# Patient Record
Sex: Male | Born: 1975
Health system: Southern US, Community
[De-identification: ages and names within clinical notes are randomized; demographics above are authoritative.]

## PROBLEM LIST (undated history)

## (undated) DIAGNOSIS — T7840XA Allergy, unspecified, initial encounter: Secondary | ICD-10-CM

## (undated) DIAGNOSIS — E785 Hyperlipidemia, unspecified: Secondary | ICD-10-CM

## (undated) DIAGNOSIS — K219 Gastro-esophageal reflux disease without esophagitis: Secondary | ICD-10-CM

## (undated) DIAGNOSIS — F0781 Postconcussional syndrome: Secondary | ICD-10-CM

## (undated) HISTORY — DX: Hyperlipidemia, unspecified: E78.5

## (undated) HISTORY — DX: Gastro-esophageal reflux disease without esophagitis: K21.9

## (undated) HISTORY — DX: Postconcussional syndrome: F07.81

## (undated) HISTORY — DX: Allergy, unspecified, initial encounter: T78.40XA

---

## 2006-04-10 ENCOUNTER — Ambulatory Visit: Payer: Self-pay | Admitting: Family Medicine

## 2006-04-30 ENCOUNTER — Ambulatory Visit: Payer: Self-pay | Admitting: Family Medicine

## 2006-04-30 LAB — CONVERTED CEMR LAB
ALT: 40 units/L (ref 0–40)
AST: 24 units/L (ref 0–37)
Albumin: 4.2 g/dL (ref 3.5–5.2)
Alkaline Phosphatase: 53 units/L (ref 39–117)
BUN: 12 mg/dL (ref 6–23)
Bilirubin, Direct: 0.1 mg/dL (ref 0.0–0.3)
CO2: 30 meq/L (ref 19–32)
Calcium: 9.3 mg/dL (ref 8.4–10.5)
Chloride: 103 meq/L (ref 96–112)
Cholesterol: 231 mg/dL (ref 0–200)
Creatinine, Ser: 1 mg/dL (ref 0.4–1.5)
Direct LDL: 172.4 mg/dL
GFR calc Af Amer: 113 mL/min
GFR calc non Af Amer: 93 mL/min
Glucose, Bld: 103 mg/dL — ABNORMAL HIGH (ref 70–99)
HDL: 41.1 mg/dL (ref >39.0)
Potassium: 4.1 meq/L (ref 3.5–5.1)
Sodium: 139 meq/L (ref 135–145)
Total Bilirubin: 0.9 mg/dL (ref 0.3–1.2)
Total CHOL/HDL Ratio: 5.6
Total Protein: 7.1 g/dL (ref 6.0–8.3)
Triglycerides: 115 mg/dL (ref 0–149)
VLDL: 23 mg/dL (ref 0–40)

## 2007-04-25 ENCOUNTER — Ambulatory Visit: Payer: Self-pay | Admitting: Family Medicine

## 2007-04-25 DIAGNOSIS — K219 Gastro-esophageal reflux disease without esophagitis: Secondary | ICD-10-CM | POA: Insufficient documentation

## 2007-04-25 DIAGNOSIS — J309 Allergic rhinitis, unspecified: Secondary | ICD-10-CM | POA: Insufficient documentation

## 2007-04-30 ENCOUNTER — Ambulatory Visit: Payer: Self-pay | Admitting: Family Medicine

## 2007-04-30 DIAGNOSIS — E7800 Pure hypercholesterolemia, unspecified: Secondary | ICD-10-CM | POA: Insufficient documentation

## 2007-04-30 DIAGNOSIS — E1169 Type 2 diabetes mellitus with other specified complication: Secondary | ICD-10-CM | POA: Insufficient documentation

## 2007-05-06 LAB — CONVERTED CEMR LAB
ALT: 51 units/L (ref 0–53)
AST: 32 units/L (ref 0–37)
Albumin: 4.3 g/dL (ref 3.5–5.2)
Alkaline Phosphatase: 52 units/L (ref 39–117)
BUN: 5 mg/dL — ABNORMAL LOW (ref 6–23)
Bilirubin, Direct: 0.1 mg/dL (ref 0.0–0.3)
CO2: 30 meq/L (ref 19–32)
Calcium: 9.7 mg/dL (ref 8.4–10.5)
Chloride: 101 meq/L (ref 96–112)
Cholesterol: 212 mg/dL (ref 0–200)
Creatinine, Ser: 1 mg/dL (ref 0.4–1.5)
Direct LDL: 168.6 mg/dL
GFR calc Af Amer: 112 mL/min
GFR calc non Af Amer: 93 mL/min
Glucose, Bld: 97 mg/dL (ref 70–99)
HDL: 38.6 mg/dL — ABNORMAL LOW (ref >39.0)
Potassium: 3.7 meq/L (ref 3.5–5.1)
Sodium: 138 meq/L (ref 135–145)
Total Bilirubin: 1.1 mg/dL (ref 0.3–1.2)
Total CHOL/HDL Ratio: 5.5
Total Protein: 7.1 g/dL (ref 6.0–8.3)
Triglycerides: 96 mg/dL (ref 0–149)
VLDL: 19 mg/dL (ref 0–40)

## 2007-08-06 ENCOUNTER — Ambulatory Visit: Payer: Self-pay | Admitting: Family Medicine

## 2007-08-12 LAB — CONVERTED CEMR LAB
Cholesterol: 221 mg/dL (ref 0–200)
Direct LDL: 179.2 mg/dL
HDL: 34.5 mg/dL — ABNORMAL LOW (ref >39.0)
Total CHOL/HDL Ratio: 6.4
Triglycerides: 87 mg/dL (ref 0–149)
VLDL: 17 mg/dL (ref 0–40)

## 2007-11-07 ENCOUNTER — Encounter: Payer: Self-pay | Admitting: Family Medicine

## 2007-11-12 ENCOUNTER — Ambulatory Visit: Payer: Self-pay | Admitting: Family Medicine

## 2007-11-13 LAB — CONVERTED CEMR LAB
ALT: 48 units/L (ref 0–53)
AST: 32 units/L (ref 0–37)
Cholesterol: 153 mg/dL (ref 0–200)
HDL: 42.7 mg/dL (ref >39.0)
LDL Cholesterol: 91 mg/dL (ref 0–99)
Total CHOL/HDL Ratio: 3.6
Triglycerides: 99 mg/dL (ref 0–149)
VLDL: 20 mg/dL (ref 0–40)

## 2007-12-17 ENCOUNTER — Ambulatory Visit: Payer: Self-pay | Admitting: Family Medicine

## 2007-12-17 DIAGNOSIS — L218 Other seborrheic dermatitis: Secondary | ICD-10-CM | POA: Insufficient documentation

## 2007-12-17 DIAGNOSIS — J069 Acute upper respiratory infection, unspecified: Secondary | ICD-10-CM | POA: Insufficient documentation

## 2008-01-26 ENCOUNTER — Telehealth: Payer: Self-pay | Admitting: Family Medicine

## 2008-05-20 ENCOUNTER — Ambulatory Visit: Payer: Self-pay | Admitting: Family Medicine

## 2008-05-20 LAB — CONVERTED CEMR LAB
ALT: 47 units/L (ref 0–53)
AST: 27 units/L (ref 0–37)
Cholesterol: 152 mg/dL (ref 0–200)
HDL: 37.7 mg/dL — ABNORMAL LOW (ref >39.00)
LDL Cholesterol: 98 mg/dL (ref 0–99)
Total CHOL/HDL Ratio: 4
Triglycerides: 83 mg/dL (ref 0.0–149.0)
VLDL: 16.6 mg/dL (ref 0.0–40.0)

## 2008-08-13 ENCOUNTER — Encounter (INDEPENDENT_AMBULATORY_CARE_PROVIDER_SITE_OTHER): Payer: Self-pay | Admitting: *Deleted

## 2008-09-30 ENCOUNTER — Ambulatory Visit: Payer: Self-pay | Admitting: Family Medicine

## 2008-09-30 LAB — CONVERTED CEMR LAB
Cholesterol, target level: 200 mg/dL
HDL goal, serum: 40 mg/dL
LDL Goal: 160 mg/dL

## 2008-10-12 ENCOUNTER — Ambulatory Visit: Payer: Self-pay | Admitting: Family Medicine

## 2008-10-12 DIAGNOSIS — R42 Dizziness and giddiness: Secondary | ICD-10-CM | POA: Insufficient documentation

## 2008-10-12 DIAGNOSIS — R519 Headache, unspecified: Secondary | ICD-10-CM | POA: Insufficient documentation

## 2008-10-12 DIAGNOSIS — R51 Headache: Secondary | ICD-10-CM | POA: Insufficient documentation

## 2008-11-04 ENCOUNTER — Ambulatory Visit: Payer: Self-pay | Admitting: Family Medicine

## 2008-11-04 DIAGNOSIS — R109 Unspecified abdominal pain: Secondary | ICD-10-CM | POA: Insufficient documentation

## 2008-11-04 DIAGNOSIS — K6289 Other specified diseases of anus and rectum: Secondary | ICD-10-CM | POA: Insufficient documentation

## 2008-11-04 DIAGNOSIS — H9319 Tinnitus, unspecified ear: Secondary | ICD-10-CM | POA: Insufficient documentation

## 2008-11-04 LAB — CONVERTED CEMR LAB
Bilirubin Urine: NEGATIVE
Blood in Urine, dipstick: NEGATIVE
Glucose, Urine, Semiquant: NEGATIVE
Ketones, urine, test strip: NEGATIVE
Nitrite: NEGATIVE
Protein, U semiquant: NEGATIVE
Specific Gravity, Urine: 1.015
Urobilinogen, UA: 0.2
WBC Urine, dipstick: NEGATIVE
pH: 7

## 2008-11-22 ENCOUNTER — Ambulatory Visit: Payer: Self-pay | Admitting: Family Medicine

## 2008-11-26 LAB — CONVERTED CEMR LAB
ALT: 48 units/L (ref 0–53)
AST: 30 units/L (ref 0–37)
Albumin: 4.6 g/dL (ref 3.5–5.2)
Alkaline Phosphatase: 53 units/L (ref 39–117)
BUN: 12 mg/dL (ref 6–23)
Bilirubin, Direct: 0.1 mg/dL (ref 0.0–0.3)
CO2: 32 meq/L (ref 19–32)
Calcium: 9.5 mg/dL (ref 8.4–10.5)
Chloride: 108 meq/L (ref 96–112)
Cholesterol: 162 mg/dL (ref 0–200)
Creatinine, Ser: 0.9 mg/dL (ref 0.4–1.5)
GFR calc non Af Amer: 103.38 mL/min (ref >60)
Glucose, Bld: 99 mg/dL (ref 70–99)
HDL: 36.4 mg/dL — ABNORMAL LOW (ref >39.00)
LDL Cholesterol: 108 mg/dL — ABNORMAL HIGH (ref 0–99)
Potassium: 4.3 meq/L (ref 3.5–5.1)
Sodium: 143 meq/L (ref 135–145)
Total Bilirubin: 1 mg/dL (ref 0.3–1.2)
Total CHOL/HDL Ratio: 4
Total Protein: 7.5 g/dL (ref 6.0–8.3)
Triglycerides: 86 mg/dL (ref 0.0–149.0)
VLDL: 17.2 mg/dL (ref 0.0–40.0)

## 2009-05-20 ENCOUNTER — Ambulatory Visit: Payer: Self-pay | Admitting: Family Medicine

## 2009-07-01 ENCOUNTER — Ambulatory Visit: Payer: Self-pay | Admitting: Family Medicine

## 2009-09-19 ENCOUNTER — Ambulatory Visit: Payer: Self-pay | Admitting: Family Medicine

## 2009-09-19 DIAGNOSIS — J029 Acute pharyngitis, unspecified: Secondary | ICD-10-CM | POA: Insufficient documentation

## 2009-09-19 LAB — CONVERTED CEMR LAB: Rapid Strep: NEGATIVE

## 2009-09-23 LAB — CONVERTED CEMR LAB
ALT: 44 units/L (ref 0–53)
AST: 25 units/L (ref 0–37)
Albumin: 4.4 g/dL (ref 3.5–5.2)
Alkaline Phosphatase: 52 units/L (ref 39–117)
Bilirubin, Direct: 0.1 mg/dL (ref 0.0–0.3)
Cholesterol: 163 mg/dL (ref 0–200)
HDL: 36.5 mg/dL — ABNORMAL LOW (ref >39.00)
LDL Cholesterol: 106 mg/dL — ABNORMAL HIGH (ref 0–99)
Total Bilirubin: 0.9 mg/dL (ref 0.3–1.2)
Total CHOL/HDL Ratio: 4
Total Protein: 7.2 g/dL (ref 6.0–8.3)
Triglycerides: 103 mg/dL (ref 0.0–149.0)
VLDL: 20.6 mg/dL (ref 0.0–40.0)

## 2009-11-15 ENCOUNTER — Encounter (INDEPENDENT_AMBULATORY_CARE_PROVIDER_SITE_OTHER): Payer: Self-pay | Admitting: *Deleted

## 2009-11-29 ENCOUNTER — Ambulatory Visit: Payer: Self-pay | Admitting: Family Medicine

## 2010-01-16 ENCOUNTER — Ambulatory Visit: Payer: Self-pay | Admitting: Family Medicine

## 2010-02-03 ENCOUNTER — Encounter: Payer: Self-pay | Admitting: Family Medicine

## 2010-02-13 ENCOUNTER — Telehealth: Payer: Self-pay | Admitting: Family Medicine

## 2010-03-28 NOTE — Assessment & Plan Note (Signed)
Summary: ?VERTIGO/CLE   Vital Signs:  Patient profile:   35 year old male Height:      69 inches Weight:      181.8 pounds BMI:     26.94 Temp:     98.2 degrees F oral Pulse rate:   64 / minute Pulse rhythm:   regular BP sitting:   110 / 80  (left arm) Cuff size:   regular  Vitals Entered By: Benny Lennert CMA (AAMA) (November 04, 2008 12:04 PM)  Hearing Screen  20db HL: Left  Right  500 hz: 20db 1000 hz: 20db 2000 hz: 20db 4000 hz: 20db   Hearing Testing Entered By: Benny Lennert CMA Duncan Dull) (November 04, 2008 12:54 PM)   History of Present Illness: Chief complaint ? vertigo  1 month ago see n by Everrett Coombe..diagnoses with vertigo.Marland Kitchengiven antivert. Room spinning, worse with moving, emesis, worse looking down. Gradually improving... happening less frequently. Much less severe then the first few days. Has not needed meclizine in last 5 days.  HAs been practicing balance some.  Ears throbbing some last week. No congestion.   B high pitched tinnitus in ears, not sure when started. No hearing loss.  Has noted lump in right posteroir neck  neck...was tender initially, no pain now.   Also...notes Occ sharp pain in perineum..no dysuria, no change in urination Had some last year....3 days ago had an episode of this, then daily since.    Current Medications (verified): 1)  Simvastatin 40 Mg  Tabs (Simvastatin) .... Take 1 Tablet By Mouth Once A Day 2)  Multivitamins   Tabs (Multiple Vitamin) .... Take 1 Tablet By Mouth Once A Day 3)  Slo-Niacin 500 Mg Cr-Tabs (Niacin) 4)  Ketoconazole 2 % Sham (Ketoconazole) .... Shampoo Daily  To Twice Daily As Needed. 5)  Echinacea 400 Mg Caps (Echinacea) .... Otc As Directed. 6)  Antivert 25 Mg Tabs (Meclizine Hcl) .... Take 1 Every 8 Hrs As Needed Dizziness 7)  Ofloxacin 400 Mg Tabs (Ofloxacin) .Marland Kitchen.. 1 Tab By Mouth X 1 Then 300 Mg Two Times A Day 8)  Ofloxacin 300 Mg Tabs (Ofloxacin) .Marland Kitchen.. 1 Tab By Mouth Two Times A Day X 10  Days  Allergies (verified): No Known Drug Allergies  Past History:  Past medical, surgical, family and social histories (including risk factors) reviewed, and no changes noted (except as noted below).  Past Medical History: Reviewed history from 04/25/2007 and no changes required. Allergic rhinitis GERD  Family History: Reviewed history from 04/25/2007 and no changes required. Father alive at age 32 with high cholesterol. Mother alive at age 33 with high cholesterol. No MI before age 91. One sister who is healthy. Paternal grandfather with prediabetes. Maternal grandmother with lung cancer. No other cancer that runs in the family.  Social History: Reviewed history from 04/25/2007 and no changes required. No tobacco use. Occasional alcoholic drinks about two per month. No drug use. 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 He has been married for three years. He has no children. He does not get regular exercise. He eats three meals per day of fruits, vegetables, occasional fast food about three times per week, and some water.   Review of Systems General:  Denies fatigue and fever. CV:  Denies chest pain or discomfort. Resp:  Denies shortness of breath. GI:  Denies abdominal pain, bloody stools, constipation, and diarrhea. GU:  Denies dysuria, erectile dysfunction, and hematuria.  Physical Exam  General:  Well-developed,well-nourished,in no acute distress; alert,appropriate and cooperative throughout examination Head:  Normocephalic and atraumatic without obvious abnormalities. No apparent alopecia or balding. Eyes:  No corneal or conjunctival inflammation noted. EOMI. Perrla. Funduscopic exam benign, without hemorrhages, exudates or papilledema. Vision grossly normal. Ears:  cerumen in lef tear, right era clear, B TMS clear Nose:  External nasal examination shows no deformity or inflammation. Nasal mucosa are pink and moist  without lesions or exudates. Mouth:  Oral mucosa and oropharynx without lesions or exudates.  Teeth in good repair. Neck:  no carotid bruit or thyromegaly no cervical or supraclavicular lymphadenopathy  Lungs:  Normal respiratory effort, chest expands symmetrically. Lungs are clear to auscultation, no crackles or wheezes. Heart:  Normal rate and regular rhythm. S1 and S2 normal without gallop, murmur, click, rub or other extra sounds. Abdomen:  Bowel sounds positive,abdomen soft and non-tender without masses, organomegaly or hernias noted. Genitalia:  Testes bilaterally descended without nodularity, tenderness or masses. No scrotal masses or lesions. No penis lesions or urethral discharge. Prostate:  ttp over perineum, rectal not performed Neurologic:  No cranial nerve deficits noted. Station and gait are normal. Plantar reflexes are down-going bilaterally. DTRs are symmetrical throughout. Sensory, motor and coordinative functions appear intact.   Impression & Recommendations:  Problem # 1:  VERTIGO (ICD-780.4) Home BPPV exercsies...if not resolving in 2 weeks...refer to ENT given persistance and tinnitus.   Nml neuro exam.     Antivert 25 Mg Tabs (Meclizine hcl) .Marland Kitchen... Take 1 every 8 hrs as needed dizziness  Problem # 2:  TINNITUS (ICD-388.30) Improved on left with ears being irrigated...if continues refer to ENT. No evidence of hearing loss.   Problem # 3:  PELVIC PAIN, ACUTE (ICD-789.09) Most likely prostate infection...will treat with ofloxacin given age <35 to cover GC/chlam.  Refer to urology if pain persisting.   Complete Medication List: 1)  Simvastatin 40 Mg Tabs (Simvastatin) .... Take 1 tablet by mouth once a day 2)  Multivitamins Tabs (Multiple vitamin) .... Take 1 tablet by mouth once a day 3)  Slo-niacin 500 Mg Cr-tabs (Niacin) 4)  Ketoconazole 2 % Sham (Ketoconazole) .... Shampoo daily  to twice daily as needed. 5)  Echinacea 400 Mg Caps (Echinacea) .... Otc as  directed. 6)  Antivert 25 Mg Tabs (Meclizine hcl) .... Take 1 every 8 hrs as needed dizziness 7)  Ofloxacin 400 Mg Tabs (Ofloxacin) .Marland Kitchen.. 1 tab by mouth x 1 then 300 mg two times a day 8)  Ofloxacin 300 Mg Tabs (Ofloxacin) .Marland Kitchen.. 1 tab by mouth two times a day x 10 days  Patient Instructions: 1)  Push fluids. 2)  Start antibitoics, complete course. 3)  Call for ENT referral if ears ringing and vertigo continues after another 2 weeks.  4)  Home exercsies for vertigo.  Prescriptions: OFLOXACIN 300 MG TABS (OFLOXACIN) 1 tab by mouth two times a day x 10 days  #20 x 0   Entered and Authorized by:   Kerby Nora MD   Signed by:   Kerby Nora MD on 11/04/2008   Method used:   Electronically to        [facility]. [address] [phone]* (retail)       [address] [place], [place]  [id number]       Ph: [phone]       Fax: [phone]   RxID:   [id number] OFLOXACIN 400 MG TABS (OFLOXACIN) 1 tab by mouth x 1  then 300 mg two times a day  #1 x 0   Entered and Authorized by:   Kerby Nora MD   Signed by:   Kerby Nora MD on 11/04/2008   Method used:   Electronically to        [facility]. [address] [phone]* (retail)       [address] [place], [place]  [address]       Ph: [phone]       Fax: [phone]   RxID:   [id number]   Current Allergies (reviewed today): No known allergies   Laboratory Results   Urine Tests  Date/Time Received: November 04, 2008 12:54 PM  Date/Time Reported: November 04, 2008 12:54 PM  Routine Urinalysis   Color: yellow Appearance: Clear Glucose: negative   (Normal Range: Negative) Bilirubin: negative   (Normal Range: Negative) Ketone: negative   (Normal Range: Negative) Spec. Gravity: 1.015   (Normal Range: 1.003-1.035) Blood: negative   (Normal Range: Negative) pH: 7.0   (Normal Range: 5.0-8.0) Protein: negative   (Normal Range: Negative) Urobilinogen: 0.2   (Normal Range: 0-1) Nitrite: negative   (Normal Range:  Negative) Leukocyte Esterace: negative   (Normal Range: Negative)

## 2010-03-28 NOTE — Miscellaneous (Signed)
[facility]   Imported By: Eleonore Chiquito 11/10/2007 12:21:12  _____________________________________________________________________  External Attachment:    Type:   Image     Comment:   External Document  Appended Document: Orders Update    Clinical Lists Changes  Observations: Added new observation of FLUVAXDUE: 10/2008 (11/11/2007 8:13) Added new observation of FLU VAX: given (11/07/2007 8:13)       Preventive Care Screening  Last Flu Shot:    Date:  11/07/2007    Next Due:  10/2008    Results:  given

## 2010-03-28 NOTE — Assessment & Plan Note (Signed)
Summary: CONGESTION/MK   Vital Signs:  Patient Profile:   35 Years Old Male Weight:      186.50 pounds Temp:     98.6 degrees F oral Pulse rate:   68 / minute Pulse rhythm:   regular BP sitting:   122 / 80  (left arm) Cuff size:   regular  Vitals Entered By: Delilah Shan (December 17, 2007 12:28 PM)                 Chief Complaint:  Congestion, green nasal discharge, cough, and low grade fever.Marland Kitchen  History of Present Illness: dandruff shampoo OTC helps some but he wants to try Rx shampoo  Acute Visit History:      The patient complains of cough, headache, nasal discharge, and sinus problems.  These symptoms began 6 days ago.  He denies fever.  Other comments include: vit C and echinachea not using other medication.        The character of the cough is described as mild.  There is no history of wheezing or shortness of breath associated with his cough.        He complains of sinus pressure, nasal congestion, purulent drainage, and frontal headache.           Current Allergies (reviewed today): No known allergies   Past Medical History:    Reviewed history from 04/25/2007 and no changes required:       Allergic rhinitis       GERD   Social History:    Reviewed history from 04/25/2007 and no changes required:       No tobacco use. Occasional alcoholic drinks about two       per month. No drug use. 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 He has been married for three       years. He has no children. He does not get regular exercise. He eats       three meals per day of fruits, vegetables, occasional fast food about       three times per week, and some water.     Review of Systems      See HPI   Physical Exam  General:     Well-developed,well-nourished,in no acute distress; alert,appropriate and cooperative throughout examination Head:     no max sinus ttp Ears:     External ear exam shows no significant  lesions or deformities.  Otoscopic examination reveals clear canals, tympanic membranes are intact bilaterally without bulging, retraction, inflammation or discharge. Hearing is grossly normal bilaterally. Nose:     External nasal examination shows no deformity or inflammation. Nasal mucosa are pink and moist without lesions or exudates. Mouth:     Oral mucosa and oropharynx without lesions or exudates.  Teeth in good repair. MMM Neck:     no carotid bruit or thyromegaly  Lungs:     Normal respiratory effort, chest expands symmetrically. Lungs are clear to auscultation, no crackles or wheezes. Heart:     Normal rate and regular rhythm. S1 and S2 normal without gallop, murmur, click, rub or other extra sounds. Cervical Nodes:     No lymphadenopathy noted    Impression & Recommendations:  Problem # 1:  URI (ICD-465.9) Symptomatic care with mucinex, decongestant and saline, if not improving may fill antibiotic.   Problem # 2:  DANDRUFF (ICD-690.18) Treat with ketokonazole shampoo once daily to two times a  day.  Complete Medication List: 1)  Simvastatin 40 Mg Tabs (Simvastatin) .... Take 1 tablet by mouth once a day 2)  Multivitamins Tabs (Multiple vitamin) .... Take 1 tablet by mouth once a day 3)  Slo-niacin 500 Mg Cr-tabs (Niacin) 4)  Amoxicillin 500 Mg Tabs (Amoxicillin) .... 2 tab by mouth two times a day x 10 days 5)  Ketoconazole 2 % Sham (Ketoconazole) .... Shampoo daily  to twice daily   Patient Instructions: 1)  Mucinex decongestant 2)  Nasal saline 3-4 times a day   Prescriptions: KETOCONAZOLE 2 % SHAM (KETOCONAZOLE) Shampoo daily  to twice daily  #1 bottle x 0   Entered and Authorized by:   Kerby Nora MD   Signed by:   Kerby Nora MD on 12/17/2007   Method used:   Print then Give to Patient   RxID:   [id number] AMOXICILLIN 500 MG TABS (AMOXICILLIN) 2 tab by mouth two times a day x 10 days  #40 x 0   Entered and Authorized by:   Kerby Nora MD   Signed by:    Kerby Nora MD on 12/17/2007   Method used:   Print then Give to Patient   RxID:   [id number]  ] Current Allergies (reviewed today): No known allergies  Current Medications (including changes made in today's visit):  SIMVASTATIN 40 MG  TABS (SIMVASTATIN) Take 1 tablet by mouth once a day MULTIVITAMINS   TABS (MULTIPLE VITAMIN) Take 1 tablet by mouth once a day SLO-NIACIN 500 MG CR-TABS (NIACIN)  AMOXICILLIN 500 MG TABS (AMOXICILLIN) 2 tab by mouth two times a day x 10 days KETOCONAZOLE 2 % SHAM (KETOCONAZOLE) Shampoo daily  to twice daily

## 2010-03-28 NOTE — Assessment & Plan Note (Signed)
Summary: 2ND HEP B SHOT/DLO  Nurse Visit   Allergies: No Known Drug Allergies  Immunizations Administered:  Hepatitis B Vaccine # 2:    Vaccine Type: State HepB Ped/Adol    Site: left deltoid    Mfr: Merck    Dose: 0.1 ml    Route: IM    Given by: Lowella Petties CMA    Exp. Date: 06/30/2011    Lot #: [id number]    VIS given: 09/12/05 version given Jul 01, 2009.  Orders Added: 1)  State-Hepatitis B Vaccine Ped/Adol 3 dose IM  [90744S] 2)  Admin 1st Vaccine [11914]

## 2010-03-28 NOTE — Miscellaneous (Signed)
Summary: [facility]  Clinical Lists Changes  Observations: Added new observation of FLU VAX: Historical (11/06/2009 10:30)      Immunization History:  Influenza Immunization History:    Influenza:  historical (11/06/2009)  [facility] [address] in [place].          Lowella Petties CMA  November 15, 2009 10:31 AM

## 2010-03-28 NOTE — Assessment & Plan Note (Signed)
Summary: HEP B/BEDSOLE/CLE  Nurse Visit   Allergies: No Known Drug Allergies  Immunizations Administered:  Hepatitis B Vaccine # 1:    Vaccine Type: HepB Adolescent    Site: left deltoid    Mfr: Merck    Dose: 0.1 ml    Route: IM    Given by: Lowella Petties CMA    Exp. Date: 11/09/2010    Lot #: [id number]    VIS given: 09/12/05 version given May 20, 2009.  Orders Added: 1)  Hepatitis B Vaccine ADOLESCENT (2 dose) [90743] 2)  Admin 1st Vaccine [16109]

## 2010-03-28 NOTE — Progress Notes (Signed)
Summary: zocor refill  Phone Note Refill Request Message from:  Fax from Pharmacy on January 26, 2008 2:27 PM  Refills Requested: Medication #1:  SIMVASTATIN 40 MG  TABS Take 1 tablet by mouth once a day Initial call taken by: Liane Comber,  January 26, 2008 2:27 PM      Prescriptions: SIMVASTATIN 40 MG  TABS (SIMVASTATIN) Take 1 tablet by mouth once a day  #30 x 4   Entered by:   Liane Comber   Authorized by:   Kerby Nora MD   Signed by:   Liane Comber on 01/26/2008   Method used:   Electronically to        [facility] [phone]* (retail)       [address]       [place], [place]  [address]       Ph: [phone] or [phone]       Fax: [phone]   RxID:   [id number] SIMVASTATIN 40 MG  TABS (SIMVASTATIN) Take 1 tablet by mouth once a day  #30 x 4   Entered by:   Liane Comber   Authorized by:   Kerby Nora MD   Signed by:   Liane Comber on 01/26/2008   Method used:   Electronically to        [facility] [address] (retail)       [address]       [place], [place]  [address]       Ph: [phone]       Fax: [phone]   RxID:   [id number]

## 2010-03-28 NOTE — Assessment & Plan Note (Signed)
Summary: HEP B #3/ BEDSOLE  Nurse Visit   Allergies: No Known Drug Allergies  Immunizations Administered:  Hepatitis B Vaccine # 3:    Vaccine Type: State HepB Ped/Adol    Site: left deltoid    Mfr: Merck    Dose: 0.1 ml    Route: IM    Given by: Linde Gillis CMA (AAMA)    Exp. Date: 05/25/2011    Lot #: [id number]    VIS given: 09/12/05 version given November 29, 2009.  Patient requested vaccination to be given in left arm.  Linde Gillis CMA Duncan Dull)  November 29, 2009 3:50 PM   Orders Added: 1)  State-Hepatitis B Vaccine Ped/Adol 3 dose IM  [90744S] 2)  Admin 1st Vaccine [phone]

## 2010-03-28 NOTE — Assessment & Plan Note (Signed)
Summary: DIZZY/CLE   Vital Signs:  Patient profile:   35 year old male Height:      69 inches Weight:      181 pounds BMI:     26.83 Temp:     98.8 degrees F oral Pulse rate:   64 / minute Pulse rhythm:   regular BP sitting:   110 / 70  (left arm) Cuff size:   regular  Vitals Entered By: Lewanda Rife LPN (October 12, 2008 12:45 PM)  CC:  dizzy comes and goes. Headache and nausea comes and goes. No headache now..  History of Present Illness: Here for dizziness--off and on x 1d--every thing spinning when opens eyes when lying down and bad HA --dizziness worse this morning, vomited this mornong, worse with turning head and lying down --taking IBP--helped HA, but dizziness came back   no hx of vertigo  Allergies (verified): No Known Drug Allergies  Review of Systems      See HPI  Physical Exam  General:  alert, well-developed, well-nourished, and well-hydrated.  NAD Eyes:  pupils equal, pupils round, no injection, and no nystagmus.  EOMs full, wearing glasses Ears:  R ear normal and L ear normal.   Nose:  no nasal discharge, no mucosal edema, and no airflow obstruction.  sinuses neg Mouth:  pharynx pink and moist and no exudates.   Neck:  no JVD and no carotid bruits.   Lungs:  normal respiratory effort, no intercostal retractions, no accessory muscle use, and normal breath sounds.   Heart:  normal rate, regular rhythm, and no murmur.   Extremities:  no edema Neurologic:  alert & oriented X3, strength normal in all extremities, sensation intact to light touch, gait normal, finger-to-nose normal, and Romberg negative--no palmer drift, able to toe-heel-tandum walk without difficulty.   Cervical Nodes:  no anterior cervical adenopathy and no posterior cervical adenopathy.   Psych:  normally interactive and subdued.     Impression & Recommendations:  Problem # 1:  VERTIGO (ICD-780.4) Assessment New suspect vertigo discussed probable etiology will use antivert 25 q8h as  needed see backin 4-5 d if not better work note --extend if needed His updated medication list for this problem includes:    Antivert 25 Mg Tabs (Meclizine hcl) .Marland Kitchen... Take 1 every 8 hrs as needed dizziness  Problem # 2:  HEADACHE (ICD-784.0) Assessment: New will use IBP as needed   Complete Medication List: 1)  Simvastatin 40 Mg Tabs (Simvastatin) .... Take 1 tablet by mouth once a day 2)  Multivitamins Tabs (Multiple vitamin) .... Take 1 tablet by mouth once a day 3)  Slo-niacin 500 Mg Cr-tabs (Niacin) 4)  Ketoconazole 2 % Sham (Ketoconazole) .... Shampoo daily  to twice daily as needed. 5)  Echinacea 400 Mg Caps (Echinacea) .... Otc as directed. 6)  Antivert 25 Mg Tabs (Meclizine hcl) .... Take 1 every 8 hrs as needed dizziness Prescriptions: ANTIVERT 25 MG TABS (MECLIZINE HCL) take 1 every 8 hrs as needed dizziness  #60 x 1   Entered and Authorized by:   Gildardo Griffes FNP   Signed by:   Gildardo Griffes FNP on 10/12/2008   Method used:   Electronically to        [facility]. [address] [phone]* (retail)       [address] [place], [place]  [address]       Ph: [phone]       Fax: [phone]  RxID:   [id number]   Current Allergies (reviewed today): No known allergies

## 2010-03-28 NOTE — Assessment & Plan Note (Signed)
Summary: CPX   Vital Signs:  Patient Profile:   35 Years Old Male Weight:      180.25 pounds Temp:     98.6 degrees F oral Pulse rate:   80 / minute Pulse rhythm:   regular BP sitting:   108 / 74  (left arm) Cuff size:   regular  Vitals Entered By: Delilah Shan (April 25, 2007 11:25 AM)                 Chief Complaint:  CPX.  History of Present Illness: no acute concerns    Current Allergies (reviewed today): No known allergies   Past Medical History:    Reviewed history and no changes required:       Allergic rhinitis       GERD   Family History:    Reviewed history and no changes required:       Father alive at age 59 with high cholesterol. Mother       alive at age 64 with high cholesterol. No MI before age 2. One sister       who is healthy. Paternal grandfather with prediabetes. Maternal       grandmother with lung cancer. No other cancer that runs in the family.  Social History:    Reviewed history and no changes required:       No tobacco use. Occasional alcoholic drinks about two       per month. No drug use. 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 He has been married for three       years. He has no children. He does not get regular exercise. He eats       three meals per day of fruits, vegetables, occasional fast food about       three times per week, and some water.     Review of Systems  The patient denies chest pain, syncope, dyspnea on exhertion, and peripheral edema.         intermittant low back ache after standing at work, no meds, no numbness, tingling   Physical Exam  General:     Well-developed,well-nourished,in no acute distress; alert,appropriate and cooperative throughout examination Eyes:     No corneal or conjunctival inflammation noted. EOMI. Perrla. Vision grossly normal. Ears:     External ear exam shows no significant lesions or deformities.  Otoscopic  examination reveals clear canals, tympanic membranes are intact bilaterally without bulging, retraction, inflammation or discharge. Hearing is grossly normal bilaterally. Nose:     External nasal examination shows no deformity or inflammation. Nasal mucosa are pink and moist without lesions or exudates. Mouth:     Oral mucosa and oropharynx without lesions or exudates.  Teeth in good repair. Neck:     no thyromegaly Lungs:     Normal respiratory effort, chest expands symmetrically. Lungs are clear to auscultation, no crackles or wheezes. Heart:     Normal rate and regular rhythm. S1 and S2 normal without gallop, murmur, click, rub or other extra sounds. Abdomen:     Bowel sounds positive,abdomen soft and non-tender without masses, organomegaly or hernias noted. Msk:     No deformity or scoliosis noted of thoracic or lumbar spine.   Pulses:     R and L posterior tibial pulses are full and equal bilaterally  Skin:     Intact without suspicious lesions or rashes Cervical Nodes:  No lymphadenopathy noted Psych:     Cognition and judgment appear intact. Alert and cooperative with normal attention span and concentration. No apparent delusions, illusions, hallucinations    Impression & Recommendations:  Problem # 1:  Preventive Health Care (ICD-V70.0) Reviewed low back care, stretching and prevention.  Reviewed preventive care protocols, scheduled due services, and updated immunizations. Encouraged exercise, weight loss, healthy eating habits.  Due for chol panel/Dm check given slight elevation last year.   Other Orders: Flu Vaccine 59yrs + [phone]) Admin 1st Vaccine (21308)   Patient Instructions: 1)  Schedule Fasting LIPID, CMET Dx 272.0    ] Prior Medications (reviewed today): None Current Allergies (reviewed today): No known allergies  Current Medications (including changes made in today's visit):  None    Tetanus/Td Immunization History:    Tetanus/Td # 1:   historical (03/29/2006)      Influenza Vaccine    Vaccine Type: Fluvax 3+    Site: right deltoid    Mfr: Sanofi Pasteur    Dose: 0.5 ml    Route: IM    Given by: Delilah Shan    Exp. Date: 08/26/2007    Lot #: [id number]    VIS given: 09/19/06 version given April 25, 2007.  Flu Vaccine Consent Questions    Do you have a history of severe allergic reactions to this vaccine? no    Any prior history of allergic reactions to egg and/or gelatin? no    Do you have a sensitivity to the preservative Thimersol? no    Do you have a past history of Guillan-Barre Syndrome? no    Do you currently have an acute febrile illness? no    Have you ever had a severe reaction to latex? no    Vaccine information given and explained to patient? yes

## 2010-03-28 NOTE — Miscellaneous (Signed)
  Clinical Lists Changes  Observations: Added new observation of H1N1#1 VAC: H1N1 ([facility]) (01/20/2008 10:40)

## 2010-03-28 NOTE — Assessment & Plan Note (Signed)
Summary: ST,MOUTH SORES/CLE   Vital Signs:  Patient profile:   35 year old Grant [name] Height:      69 inches Weight:      180.13 pounds BMI:     26.70 Temp:     98.9 degrees F oral Pulse rate:   72 / minute Pulse rhythm:   regular BP sitting:   122 / 80  (left arm) Cuff size:   regular  Vitals Entered By: Linde Gillis CMA Duncan Dull) (September 19, 2009 9:22 AM) CC: ? strep, sore in mouth, swollen lymp nodes   History of Present Illness: 35 yo here for ? strep throat.  woke up a few days ago with sore throat, noticed sores under his tongue and now has enlarged node under his left arm. Node was very painful and red yesterday, now looks much better. No fevers, chills, no nausea, vomiting, or abdominal pain. Mild ear pain last week, now resolved.  Current Medications (verified): 1)  Simvastatin 40 Mg  Tabs (Simvastatin) .... Take 1 Tablet By Mouth Once A Day 2)  Multivitamins   Tabs (Multiple Vitamin) .... Take 1 Tablet By Mouth Once A Day 3)  Slo-Niacin 500 Mg Cr-Tabs (Niacin) 4)  Ketoconazole 2 % Sham (Ketoconazole) .... Shampoo Daily  To Twice Daily As Needed. 5)  Echinacea 400 Mg Caps (Echinacea) .... Otc As Directed. 6)  Antivert 25 Mg Tabs (Meclizine Hcl) .... Take 1 Every 8 Hrs As Needed Dizziness 7)  Ofloxacin 400 Mg Tabs (Ofloxacin) .Marland Kitchen.. 1 Tab By Mouth X 1 Then 300 Mg Two Times A Day 8)  Ofloxacin 300 Mg Tabs (Ofloxacin) .Marland Kitchen.. 1 Tab By Mouth Two Times A Day X 10 Days 9)  Mobic 15 Mg Tabs (Meloxicam) .Marland Kitchen.. 1 Tab By Mouth Daily As Needed Pain 10)  First-Dukes Mouthwash  Susp (Diphenhyd-Hydrocort-Nystatin) .... Use As Directed  Allergies (verified): No Known Drug Allergies  Review of Systems      See HPI General:  Denies fever. ENT:  Complains of earache; denies ear discharge. CV:  Denies chest pain or discomfort. Resp:  Denies chest discomfort, cough, shortness of breath, sputum productive, and wheezing.  Physical Exam  General:  Well-developed,well-nourished,in no acute  distress; alert,appropriate and cooperative throughout examination VSS stable, afebrile and non toxic. Ears:  External ear exam shows no significant lesions or deformities.  Otoscopic examination reveals clear canals, tympanic membranes are intact bilaterally without bulging, retraction, inflammation or discharge. Hearing is grossly normal bilaterally. Nose:  External nasal examination shows no deformity or inflammation. Nasal mucosa are pink and moist without lesions or exudates. Mouth:  aphthous ulcer(s).   mild pharyngeal erythema, no exudate. Neck:  no carotid bruit or thyromegaly no cervical or supraclavicular lymphadenopathy  Lungs:  Normal respiratory effort, chest expands symmetrically. Lungs are clear to auscultation, no crackles or wheezes. Heart:  Normal rate and regular rhythm. S1 and S2 normal without gallop, murmur, click, rub or other extra sounds. Extremities:  no edema  Axillary Nodes:  L axillary LN enlarged and L axillary LN tender.   Psych:  Cognition and judgment appear intact. Alert and cooperative with normal attention span and concentration. No apparent delusions, illusions, hallucinations   Impression & Recommendations:  Problem # 1:  URI (ICD-465.9) Assessment New Reassurance provided, axillary lymph node enlarged but no cellulitis.  Mobic as needed pain, magic mouthwash for comfort. His updated medication list for this problem includes:    Mobic 15 Mg Tabs (Meloxicam) .Marland Kitchen... 1 tab by mouth daily as needed pain  Complete Medication List: 1)  Simvastatin 40 Mg Tabs (Simvastatin) .... Take 1 tablet by mouth once a day 2)  Multivitamins Tabs (Multiple vitamin) .... Take 1 tablet by mouth once a day 3)  Slo-niacin 500 Mg Cr-tabs (Niacin) 4)  Ketoconazole 2 % Sham (Ketoconazole) .... Shampoo daily  to twice daily as needed. 5)  Echinacea 400 Mg Caps (Echinacea) .... Otc as directed. 6)  Antivert 25 Mg Tabs (Meclizine hcl) .... Take 1 every 8 hrs as needed dizziness 7)   Ofloxacin 400 Mg Tabs (Ofloxacin) .Marland Kitchen.. 1 tab by mouth x 1 then 300 mg two times a day 8)  Ofloxacin 300 Mg Tabs (Ofloxacin) .Marland Kitchen.. 1 tab by mouth two times a day x 10 days 9)  Mobic 15 Mg Tabs (Meloxicam) .Marland Kitchen.. 1 tab by mouth daily as needed pain 10)  First-dukes Mouthwash Susp (Diphenhyd-hydrocort-nystatin) .... Use as directed  Other Orders: TLB-Lipid Panel (80061-LIPID) TLB-Hepatic/Liver Function Pnl (80076-HEPATIC) Rapid Strep (64332) Prescriptions: FIRST-DUKES MOUTHWASH  SUSP (DIPHENHYD-HYDROCORT-NYSTATIN) Use as directed  #6 ounces x 1   Entered and Authorized by:   Ruthe Mannan MD   Signed by:   Ruthe Mannan MD on 09/19/2009   Method used:   Electronically to        [facility] [address] [phone]* (retail)       [address] [place], [place]  [id number]       Ph: [phone]       Fax: [phone]   RxID:   [id number] MOBIC 15 MG TABS (MELOXICAM) 1 tab by mouth daily as needed pain  #30 x 0   Entered and Authorized by:   Ruthe Mannan MD   Signed by:   Ruthe Mannan MD on 09/19/2009   Method used:   Electronically to        [facility] [address] [phone]* (retail)       [address] [place], [place]  [id number]       Ph: [phone]       Fax: [phone]   RxID:   [id number]   Current Allergies (reviewed today): No known allergies   Prevention & Chronic Care Immunizations   Influenza vaccine: given  (11/07/2007)   Influenza vaccine due: 10/2008    Tetanus booster: 03/29/2006: historical   Tetanus booster due: 03/29/2016    Pneumococcal vaccine: Not documented  Other Screening   Smoking status: never  (09/30/2008)  Lipids   Total Cholesterol: 162  (11/22/2008)   Lipid panel action/deferral: Lipid Panel ordered   LDL: 108  (11/22/2008)   LDL Direct: 179.2  (08/06/2007)   HDL: 36.40  (11/22/2008)   Triglycerides: 86.0  (11/22/2008)    SGOT (AST): 30  (11/22/2008)   BMP action: Ordered   SGPT (ALT): 48  (11/22/2008)   Alkaline  phosphatase: 53  (11/22/2008)   Total bilirubin: 1.0  (11/22/2008)  Self-Management Support :    Lipid self-management support: Not documented   Laboratory Results    Wet [place]/KOH  Other Tests  Rapid Strep: negative

## 2010-03-30 NOTE — Progress Notes (Signed)
Summary: Switch from Simvastatin to Lipitor  Phone Note From Pharmacy Call back at [phone]   Caller: [facility] [address] Call For: Dr. [name]  Summary of Call: Received faxed request from pharmacy patient would like to switch from Simvastatin to Liptor.  Please advise. Initial call taken by: Linde Gillis CMA Duncan Dull),  February 13, 2010 9:41 AM  Follow-up for Phone Call        Okay to change.     New/Updated Medications: LIPITOR 40 MG TABS (ATORVASTATIN CALCIUM) Take 1 tablet by mouth once a day Prescriptions: LIPITOR 40 MG TABS (ATORVASTATIN CALCIUM) Take 1 tablet by mouth once a day  #30 x 11   Entered and Authorized by:   Kerby Nora MD   Signed by:   Kerby Nora MD on 02/15/2010   Method used:   Electronically to        [facility] [phone]* (retail)       [address]       [place], [place]  [address]       Ph: [phone] or [phone]       Fax: [phone]   RxID:   [id number]

## 2010-07-14 NOTE — Assessment & Plan Note (Signed)
[facility]                           [facility] [place] OFFICE NOTE   NAME:CABIGTINGJarius, [name]                      MRN:          [id number]  DATE:04/10/2006                            DOB:          August 06, 1975    CHIEF COMPLAINT:  A 35 year old male here to establish new doctor.   HISTORY OF PRESENT ILLNESS:  Grant [name] moved to [place] from [place]  two years ago and from [place] to [place] in 2004. He comes to  clinic today to set up a primary care doctor. He has no acute concerns.   REVIEW OF SYSTEMS:  Occasional headache about 1-2 times per month. No  nausea, vomiting, no blurred vision. No photophonophobia. He  occasionally does have tenderness to palpation over his right temple,  although it does not seem to be associated with when he has headaches.  This does not bother him frequently. Wears glasses, no hearing problems.  No dyspnea, no chest pain, no palpitations, no shortness of breath. No  nausea, vomiting, diarrhea, constipation or rectal bleeding. Occasional  sharp tenderness in underside of groin over base of penis. No testicular  masses. No urinary symptoms, no penile discharge, no dysuria, no  abdominal pain.   PAST MEDICAL HISTORY:  1. GERD.  2. Allergies.   HOSPITALIZATIONS/SURGERIES/PROCEDURES:  1979 typhoid fever.   ALLERGIES:  None.   MEDICATIONS:  Centrum vitamin daily.   SOCIAL HISTORY:  No tobacco use. Occasional alcoholic drinks about two  per month. No drug use. 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 He has been married for three  years. He has no children. He does not get regular exercise. He eats  three meals per day of fruits, vegetables, occasional fast food about  three times per week, and some water.   FAMILY HISTORY:  Father alive at age 61 with high cholesterol. Mother  alive at age 7 with high cholesterol. No MI before age 72. One sister  who is healthy.  Paternal grandfather with prediabetes. Maternal  grandmother with lung cancer. No other cancer that runs in the family.   PHYSICAL EXAMINATION:  VITAL SIGNS:  Height 69 inches, weight 178 making  BMI around 25, pulse 80, temperature 98.5.  GENERAL:  Slightly overweight-appearing male in no apparent distress.  HEENT:  PERRLA, extraocular muscles intact. Oropharynx clear. Tympanic  membranes clear. Nares clear. No papilledema.  NECK:  No thyromegaly. No lymphadenopathy supraclavicular or cervical.  NEUROLOGICAL:  Cranial nerves II-XII grossly intact. Sensation intact in  upper and lower extremities. Cerebellar intact. Gait within normal  limits.  CARDIOVASCULAR:  Regular rate and rhythm. No murmurs, rubs or gallops.  PULMONARY:  Clear to auscultation bilaterally. No wheezes, rales or  rhonchi.  ABDOMEN:  Soft, nontender, normoactive bowel sounds. No  hepatosplenomegaly.  SKIN:  No worrisome lesions.   ASSESSMENT/PLAN:  Complete physical exam:  He is due for tetanus which  was given in form of DTAP today. He is also due for cholesterol screen  given his family history. We will also check a complete metabolic panel  fasting.  As far as his concerns about occasional headache and the  tenderness to palpation over his right temple intermittently, if this  becomes more frequent he will return for further workup. His tenderness  to palpation at the base of his penis is not ongoing right now, and he  was instructed to return if he has symptoms. We also discussed  testicular exam and how to do this. If he does have continued concerns,  he will return for rectal exam to palpate the prostate, urinalysis, and  genitourinary exam.     Grant Nora, MD  Electronically Signed    AB/MedQ  DD: 04/10/2006  DT: 04/10/2006  Job #: [id number]

## 2011-02-22 ENCOUNTER — Other Ambulatory Visit: Payer: Self-pay | Admitting: Family Medicine

## 2011-02-25 ENCOUNTER — Other Ambulatory Visit: Payer: Self-pay | Admitting: Family Medicine

## 2011-03-19 ENCOUNTER — Encounter: Payer: Self-pay | Admitting: Family Medicine

## 2011-03-20 ENCOUNTER — Encounter: Payer: Self-pay | Admitting: *Deleted

## 2011-03-20 ENCOUNTER — Ambulatory Visit (INDEPENDENT_AMBULATORY_CARE_PROVIDER_SITE_OTHER): Payer: Self-pay | Admitting: Family Medicine

## 2011-03-20 ENCOUNTER — Encounter: Payer: Self-pay | Admitting: Family Medicine

## 2011-03-20 DIAGNOSIS — M62838 Other muscle spasm: Secondary | ICD-10-CM | POA: Insufficient documentation

## 2011-03-20 DIAGNOSIS — F0781 Postconcussional syndrome: Secondary | ICD-10-CM

## 2011-03-20 HISTORY — DX: Postconcussional syndrome: F07.81

## 2011-03-20 MED ORDER — CYCLOBENZAPRINE HCL 10 MG PO TABS: 10.0000 mg | ORAL_TABLET | Freq: Every evening | ORAL | Status: AC | PRN

## 2011-03-20 MED ORDER — DICLOFENAC SODIUM 75 MG PO TBEC: 75.0000 mg | DELAYED_RELEASE_TABLET | Freq: Two times a day (BID) | ORAL | Status: DC
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
## 2011-03-20 NOTE — Patient Instructions (Signed)
Heat on neck, gentle range of motion exercises to stretch neck. Start diclofenac for pain/inflammation in neck. Can use muscle relaxant at night. For concussion... Stop all reading, TV, computer use.. Total mind rest until symptoms improving.  Stay out of work until next Monday given use computer at work.  Call next week if symptoms not improving or if new neurologic symptoms.

## 2011-03-20 NOTE — Assessment & Plan Note (Signed)
No suggestion of bleed on exam, nml neuro exam. Limit brain activity until symptoms improving. Stay out of work x next 5-7 days.

## 2011-03-20 NOTE — Assessment & Plan Note (Signed)
Heat on neck, gentle range of motion exercises to stretch neck. Info given. Start diclofenac for pain/inflammation in neck. Can use muscle relaxant at night.

## 2011-03-28 ENCOUNTER — Ambulatory Visit (INDEPENDENT_AMBULATORY_CARE_PROVIDER_SITE_OTHER)
Admission: RE | Admit: 2011-03-28 | Discharge: 2011-03-28 | Disposition: A | Discharge status: Home / Self Care | Payer: BC Managed Care – PPO | Source: Ambulatory Visit | Attending: Family Medicine | Admitting: Family Medicine

## 2011-03-28 ENCOUNTER — Ambulatory Visit (INDEPENDENT_AMBULATORY_CARE_PROVIDER_SITE_OTHER): Payer: BC Managed Care – PPO | Admitting: Family Medicine

## 2011-03-28 ENCOUNTER — Encounter: Payer: Self-pay | Admitting: Family Medicine

## 2011-03-28 DIAGNOSIS — R413 Other amnesia: Secondary | ICD-10-CM

## 2011-03-28 DIAGNOSIS — F0781 Postconcussional syndrome: Secondary | ICD-10-CM

## 2011-03-28 DIAGNOSIS — M542 Cervicalgia: Secondary | ICD-10-CM

## 2011-03-28 DIAGNOSIS — R51 Headache: Secondary | ICD-10-CM
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
## 2011-04-06 ENCOUNTER — Ambulatory Visit (INDEPENDENT_AMBULATORY_CARE_PROVIDER_SITE_OTHER): Payer: BC Managed Care – PPO | Admitting: Neurology

## 2011-04-06 ENCOUNTER — Encounter: Payer: Self-pay | Admitting: Neurology

## 2011-04-06 VITALS — BP 128/78 | HR 88 | Ht 69.0 in | Wt 176.0 lb

## 2011-04-06 DIAGNOSIS — M62838 Other muscle spasm: Secondary | ICD-10-CM

## 2011-04-06 MED ORDER — TIZANIDINE HCL 4 MG PO TABS: 4.0000 mg | ORAL_TABLET | Freq: Three times a day (TID) | ORAL | Status: AC | PRN
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
## 2011-04-18 ENCOUNTER — Telehealth: Payer: Self-pay | Admitting: Neurology

## 2011-04-18 NOTE — Telephone Encounter (Signed)
Pt states that he is feeling well enough to go to work but that every now and then he is still feeling "tightening." He is also having the pharmacy send over a request for a refill on his rx.

## 2011-04-19 NOTE — Telephone Encounter (Signed)
The patient returned my call. States he is still having pain and discomfort at the base of his neck. 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 He reports that the Zanaflex was too strong and made him nauseous. He is taking the Diclofenac and Antivert and says that those two together help. He is asking for Dr. Modesto Charon to refill those (the Diclofenac is prescribed by the PCP). He also wants to know if this pain and discomfort is "normal." I told him I would check with Dr. Modesto Charon re: med refills and that I felt like the neck discomfort would just take time to heal. I told him that we would be in touch tomorrow with any additional information. The patient is ok to wait. **Dr. Modesto Charon, please advise med refill requests.

## 2011-04-19 NOTE — Telephone Encounter (Signed)
I left a message for the patient to return my call. Message left on mobile number.

## 2011-04-20 ENCOUNTER — Other Ambulatory Visit: Payer: Self-pay | Admitting: Neurology

## 2011-04-20 MED ORDER — DICLOFENAC SODIUM 75 MG PO TBEC: 75.0000 mg | DELAYED_RELEASE_TABLET | Freq: Two times a day (BID) | ORAL | Status: AC
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
## 2011-04-25 ENCOUNTER — Other Ambulatory Visit: Payer: Self-pay | Admitting: *Deleted

## 2011-04-25 ENCOUNTER — Ambulatory Visit: Payer: Self-pay | Admitting: Chiropractor

## 2011-04-25 NOTE — Telephone Encounter (Signed)
Faxed request from [facility] [address], last filled 09/26/09.

## 2011-04-26 ENCOUNTER — Other Ambulatory Visit: Payer: Self-pay | Admitting: *Deleted

## 2011-04-26 MED ORDER — MECLIZINE HCL 25 MG PO TABS: 25.0000 mg | ORAL_TABLET | Freq: Four times a day (QID) | ORAL | Status: DC | PRN

## 2011-04-26 NOTE — Telephone Encounter (Signed)
Fax request for what? Can I do on computer .Marland Kitchen If so enter.. If not put in [place]'s inbox.

## 2012-07-04 ENCOUNTER — Telehealth: Payer: Self-pay

## 2012-07-04 NOTE — Telephone Encounter (Signed)
Pt's wife request copy of pt's immunization record given at [facility] for med exam for immigration purposes. Copy at front desk for pt to pick up.

## 2012-11-25 ENCOUNTER — Other Ambulatory Visit: Payer: Self-pay | Admitting: Family Medicine

## 2013-03-30 ENCOUNTER — Encounter: Payer: Self-pay | Admitting: Family Medicine

## 2013-03-30 ENCOUNTER — Ambulatory Visit (INDEPENDENT_AMBULATORY_CARE_PROVIDER_SITE_OTHER): Payer: BC Managed Care – PPO | Admitting: Family Medicine

## 2013-03-30 VITALS — BP 122/78 | HR 79 | Temp 98.6°F | Ht 69.75 in | Wt 170.5 lb

## 2013-03-30 DIAGNOSIS — B9789 Other viral agents as the cause of diseases classified elsewhere: Principal | ICD-10-CM

## 2013-03-30 DIAGNOSIS — J069 Acute upper respiratory infection, unspecified: Secondary | ICD-10-CM

## 2013-03-30 DIAGNOSIS — J208 Acute bronchitis due to other specified organisms: Secondary | ICD-10-CM | POA: Insufficient documentation

## 2013-03-30 MED ORDER — CETIRIZINE HCL 10 MG PO TABS: 10.0000 mg | ORAL_TABLET | Freq: Every day | ORAL | Status: AC

## 2013-03-30 MED ORDER — HYDROCODONE-HOMATROPINE 5-1.5 MG/5ML PO SYRP: 5.0000 mL | ORAL_SOLUTION | Freq: Every evening | ORAL | Status: DC | PRN

## 2013-03-30 MED ORDER — MECLIZINE HCL 25 MG PO TABS: 25.0000 mg | ORAL_TABLET | Freq: Four times a day (QID) | ORAL | Status: DC | PRN

## 2013-03-30 NOTE — Assessment & Plan Note (Signed)
Symptomatic care. Cough suppressant at night prn.

## 2013-03-30 NOTE — Progress Notes (Signed)
   Subjective:    Patient ID: Grant HawsGlenn Abshier, male    DOB: 1976-02-10, 38 y.o.   MRN: [id number]  Cough This is a new problem. The current episode started in the past 7 days (3 days). The problem has been gradually worsening. The problem occurs constantly. The cough is productive of sputum. Associated symptoms include a fever, myalgias, rhinorrhea and a sore throat. Pertinent negatives include no chills, ear congestion, ear pain, nasal congestion, shortness of breath, weight loss or wheezing. Associated symptoms comments: Fever 100 3 days ago, none since.. Treatments tried: tylenol for fever, benadryl, phenylephrine, mucinex nighttime, claritin. The treatment provided mild relief. His past medical history is significant for environmental allergies. There is no history of asthma, bronchiectasis, bronchitis, COPD, emphysema or pneumonia. sick contact at work,  he is a nonsmoker      Review of Systems  Constitutional: Positive for fever. Negative for chills and weight loss.  HENT: Positive for rhinorrhea and sore throat. Negative for ear pain.   Respiratory: Positive for cough. Negative for shortness of breath and wheezing.   Musculoskeletal: Positive for myalgias.  Allergic/Immunologic: Positive for environmental allergies.       Objective:   Physical Exam  Constitutional: Vital signs are normal. He appears well-developed and well-nourished.  Non-toxic appearance. He does not appear ill. No distress.  HENT:  Head: Normocephalic and atraumatic.  Right Ear: Hearing, tympanic membrane, external ear and ear canal normal. No tenderness. No foreign bodies. Tympanic membrane is not retracted and not bulging.  Left Ear: Hearing, tympanic membrane, external ear and ear canal normal. No tenderness. No foreign bodies. Tympanic membrane is not retracted and not bulging.  Nose: Rhinorrhea present. No mucosal edema. Right sinus exhibits no maxillary sinus tenderness and no frontal sinus tenderness. Left  sinus exhibits no maxillary sinus tenderness and no frontal sinus tenderness.  Mouth/Throat: Uvula is midline, oropharynx is clear and moist and mucous membranes are normal. Normal dentition. No dental caries. No oropharyngeal exudate or tonsillar abscesses.  Eyes: Conjunctivae, EOM and lids are normal. Pupils are equal, round, and reactive to light. Lids are everted and swept, no foreign bodies found.  Neck: Trachea normal, normal range of motion and phonation normal. Neck supple. Carotid bruit is not present. No mass and no thyromegaly present.  Cardiovascular: Normal rate, regular rhythm, S1 normal, S2 normal, normal heart sounds, intact distal pulses and normal pulses.  Exam reveals no gallop.   No murmur heard. Pulmonary/Chest: Effort normal and breath sounds normal. No respiratory distress. He has no wheezes. He has no rhonchi. He has no rales.  Abdominal: Soft. Normal appearance and bowel sounds are normal. There is no hepatosplenomegaly. There is no tenderness. There is no rebound, no guarding and no CVA tenderness. No hernia.  Lymphadenopathy:    He has cervical adenopathy.  Neurological: He is alert. He has normal reflexes.  Skin: Skin is warm, dry and intact. No rash noted.  Psychiatric: He has a normal mood and affect. His speech is normal and behavior is normal. Judgment normal.          Assessment & Plan:

## 2013-03-30 NOTE — Patient Instructions (Signed)
Cough suppressant at night. Mucinex DM during the day. Rest, fluids. Expect at least 4-5 more days of illness if not a total of 14 days of illness.

## 2013-03-30 NOTE — Progress Notes (Signed)
Pre-visit discussion using our clinic review tool. No additional management support is needed unless otherwise documented below in the visit note.  

## 2015-05-23 ENCOUNTER — Ambulatory Visit (INDEPENDENT_AMBULATORY_CARE_PROVIDER_SITE_OTHER): Payer: 59 | Admitting: Family Medicine

## 2015-05-23 ENCOUNTER — Encounter: Payer: Self-pay | Admitting: Family Medicine

## 2015-05-23 VITALS — BP 124/86 | HR 74 | Temp 98.8°F | Ht 69.75 in | Wt 172.5 lb

## 2015-05-23 DIAGNOSIS — F39 Unspecified mood [affective] disorder: Secondary | ICD-10-CM

## 2015-05-23 DIAGNOSIS — R4586 Emotional lability: Secondary | ICD-10-CM

## 2015-05-23 DIAGNOSIS — R42 Dizziness and giddiness: Secondary | ICD-10-CM

## 2015-05-23 MED ORDER — IBUPROFEN 800 MG PO TABS: 800.0000 mg | ORAL_TABLET | Freq: Three times a day (TID) | ORAL | Status: DC | PRN

## 2015-05-23 MED ORDER — SCOPOLAMINE 1 MG/3DAYS TD PT72: 1.0000 | MEDICATED_PATCH | TRANSDERMAL | Status: DC

## 2015-05-23 MED ORDER — MECLIZINE HCL 25 MG PO TABS: 25.0000 mg | ORAL_TABLET | Freq: Four times a day (QID) | ORAL | Status: DC | PRN

## 2015-05-23 MED ORDER — ESCITALOPRAM OXALATE 10 MG PO TABS: 10.0000 mg | ORAL_TABLET | Freq: Every day | ORAL | Status: DC

## 2015-05-23 NOTE — Patient Instructions (Signed)
Take the lexapro as prescribed.  Use the patch on your trip.  Follow up closely with your primary.  Take care  Dr. Adriana Simasook

## 2015-05-24 DIAGNOSIS — F4321 Adjustment disorder with depressed mood: Secondary | ICD-10-CM | POA: Insufficient documentation

## 2015-05-24 NOTE — Progress Notes (Signed)
Subjective:  Patient ID: Grant HawsGlenn [name], male    DOB: [birth year]  Age: 40 y.o. MRN: [id number]  CC: Vertigo; Mood Disturbance  HPI:  40 year old male presents with the above complaints.  Vertigo  Patient reports that he developed vertigo last night.  He states he has a history of this.  He describes it as equilibrium. He reports associated headache (feels as if something is "floating" on top of his head).   He also reports associated nausea.  He's taken some antihistamines with improvement.  Depression  Patient reports issues with his mood.  He states that he thinks this is exacerbating his vertigo.  He states that for quite some time now he's been experiencing difficulty concentrating, anger, irritability.  He states that this is been an ongoing issue.  This appears to be situational/stress-induced as he's had several problems recently. He states that he hates his job and has had significant difficulties at work.  He's also lost a substantial amount in the stock market.  No known relieving factors.   He is concerned about his mood and would like to discuss starting medication today.  Social Hx   Social History   Social History  . Marital Status: Married    Spouse Name: N/A  . Number of Children: N/A  . Years of Education: N/A   Occupational History  . student    Social History Main Topics  . Smoking status: Never Smoker   . Smokeless tobacco: Never Used  . Alcohol Use: Yes  . Drug Use: No  . Sexual Activity: Not Asked   Other Topics Concern  . 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      Regular exercise- no   He eats 3 meals per day of fruits, veggies, ocassional fast food about 3 times per week and some water            Review of Systems  Neurological: Positive for dizziness and headaches.  Psychiatric/Behavioral: Positive for decreased concentration  and agitation.   Objective:  BP 124/86 mmHg  Pulse 74  Temp(Src) 98.8 F (37.1 C) (Oral)  Ht 5' 9.75" (1.772 m)  Wt 172 lb 8 oz (78.245 kg)  BMI 24.92 kg/m2  SpO2 99%  BP/Weight 05/23/2015 03/30/2013 04/06/2011  Systolic BP 124 122 128  Diastolic BP 86 78 78  Wt. (Lbs) 172.5 170.5 176  BMI 24.92 24.63 25.98   Physical Exam  Constitutional: He is oriented to person, place, and time. He appears well-developed. No distress.  HENT:  Head: Normocephalic and atraumatic.  Eyes: Conjunctivae and EOM are normal. Pupils are equal, round, and reactive to light.  Cardiovascular: Normal rate and regular rhythm.   Pulmonary/Chest: Effort normal and breath sounds normal.  Neurological: He is alert and oriented to person, place, and time.  No focal deficits.  Psychiatric:  Flat affect, depressed mood.  Vitals reviewed.  Lab Results  Component Value Date   GLUCOSE 99 11/22/2008   CHOL 163 09/19/2009   TRIG 103.0 09/19/2009   HDL 36.50* 09/19/2009   LDLDIRECT 179.2 08/06/2007   LDLCALC 106* 09/19/2009   ALT 44 09/19/2009   AST 25 09/19/2009   NA 143 11/22/2008   K 4.3 11/22/2008   CL 108 11/22/2008   CREATININE 0.9 11/22/2008   BUN 12 11/22/2008   CO2 32 11/22/2008    Assessment & Plan:   Problem List Items Addressed  This Visit    VERTIGO - Primary    Established problem with exacerbation. Advised meclizine. Sent today. Patient also requests scopolamine as he has upcoming trip (cruise).      Relevant Medications   scopolamine (TRANSDERM-SCOP, 1.5 MG,) 1 MG/3DAYS   meclizine (ANTIVERT) 25 MG tablet   Mood change (HCC)    New problem. Depression versus adjustment disorder. Patient requested medication today. Starting SSRI today. Rx for Lexapro sent.         Meds ordered this encounter  Medications  . escitalopram (LEXAPRO) 10 MG tablet    Sig: Take 1 tablet (10 mg total) by mouth daily.    Dispense:  90 tablet    Refill:  1  . scopolamine (TRANSDERM-SCOP, 1.5 MG,)  1 MG/3DAYS    Sig: Place 1 patch (1.5 mg total) onto the skin every 3 (three) days.    Dispense:  10 patch    Refill:  0  . meclizine (ANTIVERT) 25 MG tablet    Sig: Take 1 tablet (25 mg total) by mouth 4 (four) times daily as needed.    Dispense:  60 tablet    Refill:  0  . ibuprofen (ADVIL,MOTRIN) 800 MG tablet    Sig: Take 1 tablet (800 mg total) by mouth every 8 (eight) hours as needed.    Dispense:  30 tablet    Refill:  0    Follow-up: PRN  Everlene Other DO [facility]

## 2015-05-24 NOTE — Assessment & Plan Note (Signed)
New problem. Depression versus adjustment disorder. Patient requested medication today. Starting SSRI today. Rx for Lexapro sent.

## 2015-05-24 NOTE — Assessment & Plan Note (Signed)
Established problem with exacerbation. Advised meclizine. Sent today. Patient also requests scopolamine as he has upcoming trip (cruise).

## 2015-09-13 ENCOUNTER — Telehealth: Payer: Self-pay | Admitting: Family Medicine

## 2015-09-13 NOTE — Telephone Encounter (Signed)
Pt is requesting pcp change to Dr. Sharen HonesGutierrez. He was previously seen by Dr. Ermalene SearingBedsole and went to Dr. Adriana Simasook b/c no app[facility] were available [facility]. [facility]. Is it ok to schedule a new patient with Dr. Sharen HonesGutierrez?

## 2015-09-13 NOTE — Telephone Encounter (Signed)
Sure - but I think I'm a bit booked out on new patients. May place in available 30 min slot over the next 3 months.

## 2015-09-13 NOTE — Telephone Encounter (Signed)
I saw patient for an acute visit. [facility] that[facility].

## 2015-09-16 NOTE — Telephone Encounter (Signed)
Scheduled Sept 8th. Pt wife aware

## 2015-11-04 ENCOUNTER — Encounter: Payer: Self-pay | Admitting: Family Medicine

## 2015-11-04 ENCOUNTER — Ambulatory Visit (INDEPENDENT_AMBULATORY_CARE_PROVIDER_SITE_OTHER): Payer: 59 | Admitting: Family Medicine

## 2015-11-04 VITALS — BP 136/90 | HR 100 | Temp 101.7°F | Ht 69.0 in | Wt 174.0 lb

## 2015-11-04 DIAGNOSIS — E7800 Pure hypercholesterolemia, unspecified: Secondary | ICD-10-CM

## 2015-11-04 DIAGNOSIS — J208 Acute bronchitis due to other specified organisms: Secondary | ICD-10-CM | POA: Diagnosis not present

## 2015-11-04 DIAGNOSIS — R509 Fever, unspecified: Secondary | ICD-10-CM | POA: Diagnosis not present

## 2015-11-04 DIAGNOSIS — F4321 Adjustment disorder with depressed mood: Secondary | ICD-10-CM

## 2015-11-04 LAB — POC INFLUENZA A&B (BINAX/QUICKVUE)
Influenza A, POC: NEGATIVE
Influenza B, POC: NEGATIVE

## 2015-11-04 MED ORDER — HYDROCODONE-HOMATROPINE 5-1.5 MG/5ML PO SYRP: 5.0000 mL | ORAL_SOLUTION | Freq: Two times a day (BID) | ORAL | 0 refills | Status: DC | PRN

## 2015-11-04 MED ORDER — ESCITALOPRAM OXALATE 5 MG PO TABS: 10.0000 mg | ORAL_TABLET | Freq: Every day | ORAL | 6 refills | Status: DC

## 2015-11-04 MED ORDER — AZITHROMYCIN 250 MG PO TABS: ORAL_TABLET | ORAL | 0 refills | Status: DC

## 2015-11-04 NOTE — Assessment & Plan Note (Addendum)
Febrile illness with cough of 5d duration. He did receive flu shot earlier this year.  Check flu swab today - negative.  Supportive care with hycodan cough syrup for night time (per patient works better than codeine), push fluids and rest, continue symptomatic care as up to now.  Provided with WASP for zpack to take in case ongoing fever or worsening productive cough over weekend. Pt agrees with plan.

## 2015-11-04 NOTE — Patient Instructions (Addendum)
Flu swab negative. Likely viral bronchitis. Treat with cheratussin for night time. Push fluids and rest, further supportive care with OTC medicines as up to now. If fever >101 past 5-6 days or worsening productive cough, fill prescription provided today (zpack).  Let us know if not improving with treatment.

## 2015-11-04 NOTE — Assessment & Plan Note (Signed)
Ongoing depressed mood, lexapro 10mg  helpful but overly sedating regardless of AM/PM dosing. Will send in 5mg  dose for patient to trial. Discussed change to different SSRI. Pt will return when feeling better for further evaluation as well as concentration evaluation.

## 2015-11-04 NOTE — Progress Notes (Signed)
Pre visit review using our clinic review tool, if applicable. No additional management support is needed unless otherwise documented below in the visit note. 

## 2015-11-04 NOTE — Progress Notes (Signed)
BP 136/90   Pulse 100   Temp (!) 101.7 F (38.7 C) (Oral)   Ht 5\' 9"  (1.753 m)   Wt 174 lb (78.9 kg)   SpO2 99%   BMI 25.70 kg/m    CC: transfer of care, sick visit Subjective:    Patient ID: Grant [name], male    DOB: 1975/08/15, 40 y.o.   MRN: [id number]  HPI: Grant [name] is a 40 y.o. male presenting on 11/04/2015 for Establish Care (Transfer from Dr. Adriana [name])   Saw Dr Grant [name] for acute visit, prior [facility] patient Grant [name]).  5d ago started with ST, PNdrainage, fever, chills, lower back pain and myalgias. + cough productive of heavy green mucous worse in the morning.   Treating with flonase, chlorpheniramine, antihistamines and tessalon perls, delsym.   H/o bronchitis, no h/o asthma.  Some seasonal allergies.  Not around smokers. No sick contacts at home.   Did [facility] flu [facility] at [facility] Grant [name] 09/2105.   Depression - lexapro 10mg  daily helping mood, started 04/2015. Finds may be overly sedating, worse if takes at night time. Ongoing trouble with concentration. 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 Asks about adderall or provigil.   Preventative: Flu shot - 09/2015 Td -2008   Lives with wife Scientist, research (physical sciences)) and daughter First generation immigrant from [place] Occ: Pharmacist at [employer]  Activity: no regular exercise Diet: lots of rice, fruits/vegetables, good water  Relevant past medical, surgical, family and social history reviewed and updated as indicated. Interim medical history since our last visit reviewed. Allergies and medications reviewed and updated. Current Outpatient Prescriptions on File Prior to Visit  Medication Sig  . cetirizine (ZYRTEC) 10 MG tablet Take 1 tablet (10 mg total) by mouth daily.  Marland Kitchen ibuprofen (ADVIL,MOTRIN) 800 MG tablet Take 1 tablet (800 mg total) by mouth every 8 (eight) hours as needed.  Marland Kitchen ketoconazole (NIZORAL) 2 % shampoo SHAMPOO ONCE TO TWICE DAILY  . meclizine (ANTIVERT)  25 MG tablet Take 1 tablet (25 mg total) by mouth 4 (four) times daily as needed.  Marland Kitchen scopolamine (TRANSDERM-SCOP, 1.5 MG,) 1 MG/3DAYS Place 1 patch (1.5 mg total) onto the skin every 3 (three) days.   No current facility-administered medications on file prior to visit.     Review of Systems Per HPI unless specifically indicated in ROS section     Objective:    BP 136/90   Pulse 100   Temp (!) 101.7 F (38.7 C) (Oral)   Ht 5\' 9"  (1.753 m)   Wt 174 lb (78.9 kg)   SpO2 99%   BMI 25.70 kg/m   Wt Readings from Last 3 Encounters:  11/04/15 174 lb (78.9 kg)  05/23/15 172 lb 8 oz (78.2 kg)  03/30/13 170 lb 8 oz (77.3 kg)    Physical Exam  Constitutional: He appears well-developed and well-nourished. No distress.  HENT:  Head: Normocephalic and atraumatic.  Right Ear: Hearing, tympanic membrane, external ear and ear canal normal.  Left Ear: Hearing, tympanic membrane, external ear and ear canal normal.  Nose: No mucosal edema or rhinorrhea. Right sinus exhibits maxillary sinus tenderness and frontal sinus tenderness. Left sinus exhibits maxillary sinus tenderness and frontal sinus tenderness.  Mouth/Throat: Uvula is midline and mucous membranes are normal. Posterior oropharyngeal erythema (mild) present. No oropharyngeal exudate, posterior oropharyngeal edema or tonsillar abscesses.  Nasal mucosal irritation  Eyes: Conjunctivae and EOM are normal. Pupils are equal, round, and reactive to light. No scleral icterus.  Neck: Normal range of motion. Neck supple.  Cardiovascular: Normal rate, regular rhythm, normal heart sounds and intact distal pulses.   No murmur heard. Pulmonary/Chest: Effort normal and breath sounds normal. No respiratory distress. He has no wheezes. He has no rales.  Lungs clear, deep dry cough present  Lymphadenopathy:    He has no cervical adenopathy.  Skin: Skin is warm and dry. No rash noted.  Nursing note and vitals reviewed.     Assessment & Plan:    Problem List Items Addressed This Visit    Adjustment disorder with depressed mood    Ongoing depressed mood, lexapro 10mg  helpful but overly sedating regardless of AM/PM dosing. Will send in 5mg  dose for patient to trial. Discussed change to different SSRI. Pt will return when feeling better for further evaluation as well as concentration evaluation.       HYPERCHOLESTEROLEMIA   Relevant Orders   Lipid panel   Basic metabolic panel   TSH   Viral bronchitis - Primary    Febrile illness with cough of 5d duration. He did receive flu shot earlier this year.  Check flu swab today - negative.  Supportive care with hycodan cough syrup for night time (per patient works better than codeine), push fluids and rest, continue symptomatic care as up to now.  Provided with WASP for zpack to take in case ongoing fever or worsening productive cough over weekend. Pt agrees with plan.      Relevant Medications   azithromycin (ZITHROMAX) 250 MG tablet    Other Visit Diagnoses    Fever, unspecified       Relevant Orders   POC Influenza A&B (Binax test) (Completed)       Follow up plan: Return if symptoms worsen or fail to improve.  Grant BoydenJavier Joelle [place], MD

## 2015-11-09 ENCOUNTER — Other Ambulatory Visit: Payer: Self-pay | Admitting: Family Medicine

## 2016-07-27 ENCOUNTER — Other Ambulatory Visit (INDEPENDENT_AMBULATORY_CARE_PROVIDER_SITE_OTHER): Payer: 59

## 2016-07-27 ENCOUNTER — Encounter (INDEPENDENT_AMBULATORY_CARE_PROVIDER_SITE_OTHER): Payer: Self-pay

## 2016-07-27 DIAGNOSIS — E7800 Pure hypercholesterolemia, unspecified: Secondary | ICD-10-CM

## 2016-07-27 LAB — BASIC METABOLIC PANEL
BUN: 11 mg/dL (ref 6–23)
CO2: 29 mEq/L (ref 19–32)
Calcium: 9.5 mg/dL (ref 8.4–10.5)
Chloride: 103 mEq/L (ref 96–112)
Creatinine, Ser: 0.99 mg/dL (ref 0.40–1.50)
GFR: 88.75 mL/min (ref >60.00)
Glucose, Bld: 110 mg/dL — ABNORMAL HIGH (ref 70–99)
Potassium: 4.2 mEq/L (ref 3.5–5.1)
Sodium: 138 mEq/L (ref 135–145)

## 2016-07-27 LAB — LIPID PANEL
Cholesterol: 226 mg/dL — ABNORMAL HIGH (ref 0–200)
HDL: 46.3 mg/dL (ref >39.00)
LDL Cholesterol: 159 mg/dL — ABNORMAL HIGH (ref 0–99)
NonHDL: 179.84
Total CHOL/HDL Ratio: 5
Triglycerides: 103 mg/dL (ref 0.0–149.0)
VLDL: 20.6 mg/dL (ref 0.0–40.0)

## 2016-07-27 LAB — TSH: TSH: 3.18 u[IU]/mL (ref 0.35–4.50)

## 2016-07-30 ENCOUNTER — Encounter: Payer: Self-pay | Admitting: *Deleted

## 2016-09-26 ENCOUNTER — Encounter: Payer: Self-pay | Admitting: Family Medicine

## 2016-09-26 ENCOUNTER — Ambulatory Visit (INDEPENDENT_AMBULATORY_CARE_PROVIDER_SITE_OTHER): Payer: 59 | Admitting: Family Medicine

## 2016-09-26 VITALS — BP 118/82 | HR 83 | Temp 98.8°F | Ht 68.25 in | Wt 184.2 lb

## 2016-09-26 DIAGNOSIS — E7800 Pure hypercholesterolemia, unspecified: Secondary | ICD-10-CM

## 2016-09-26 DIAGNOSIS — R400 Somnolence: Secondary | ICD-10-CM

## 2016-09-26 DIAGNOSIS — Z0000 Encounter for general adult medical examination without abnormal findings: Secondary | ICD-10-CM

## 2016-09-26 DIAGNOSIS — F4321 Adjustment disorder with depressed mood: Secondary | ICD-10-CM

## 2016-09-26 DIAGNOSIS — R739 Hyperglycemia, unspecified: Secondary | ICD-10-CM

## 2016-09-26 MED ORDER — ESCITALOPRAM OXALATE 5 MG PO TABS: 10.0000 mg | ORAL_TABLET | Freq: Every day | ORAL | 3 refills | Status: DC

## 2016-09-26 NOTE — Assessment & Plan Note (Signed)
Discussed with patient - anticipate due to not getting enough sleep. If he is averaging 7-8 hours and still noticing trouble with this, consider OSA evaluation. rec he try lexapro 5mg  daily in place of 10mg  - he will continue to trial with med dose changes.

## 2016-09-26 NOTE — Assessment & Plan Note (Signed)
Preventative protocols reviewed and updated unless pt declined. Discussed healthy diet and lifestyle.  

## 2016-09-26 NOTE — Assessment & Plan Note (Signed)
Discussed elevated fasting sugar reading - rec avoid added sugars.

## 2016-09-26 NOTE — Patient Instructions (Addendum)
You are doing well today. We will keep a close eye on sugar levels and cholesterol levels.  Remember - more fruits and vegetables, more fish, less red meat and dairy products.  More soy, nuts, beans, barley, lentils, oats and plant sterol ester enriched margarine instead of butter. Avoid added sugars in the diet.  Return as needed or in 1 year for next physical.  Health Maintenance, Male A healthy lifestyle and preventive care is important for your health and wellness. Ask your health care provider about what schedule of regular examinations is right for you. What should I know about weight and diet? Eat a Healthy Diet  Eat plenty of vegetables, fruits, whole grains, low-fat dairy products, and lean protein.  Do not eat a lot of foods high in solid fats, added sugars, or salt.  Maintain a Healthy Weight Regular exercise can help you achieve or maintain a healthy weight. You should:  Do at least 150 minutes of exercise each week. The exercise should increase your heart rate and make you sweat (moderate-intensity exercise).  Do strength-training exercises at least twice a week.  Watch Your Levels of Cholesterol and Blood Lipids  Have your blood tested for lipids and cholesterol every 5 years starting at 41 years of age. If you are at high risk for heart disease, you should start having your blood tested when you are 41 years old. You may need to have your cholesterol levels checked more often if: ? Your lipid or cholesterol levels are high. ? You are older than 41 years of age. ? You are at high risk for heart disease.  What should I know about cancer screening? Many types of cancers can be detected early and may often be prevented. Lung Cancer  You should be screened every year for lung cancer if: ? You are a current smoker who has smoked for at least 30 years. ? You are a former smoker who has quit within the past 15 years.  Talk to your health care provider about your screening  options, when you should start screening, and how often you should be screened.  Colorectal Cancer  Routine colorectal cancer screening usually begins at 41 years of age and should be repeated every 5-10 years until you are 41 years old. You may need to be screened more often if early forms of precancerous polyps or small growths are found. Your health care provider may recommend screening at an earlier age if you have risk factors for colon cancer.  Your health care provider may recommend using home test kits to check for hidden blood in the stool.  A small camera at the end of a tube can be used to examine your colon (sigmoidoscopy or colonoscopy). This checks for the earliest forms of colorectal cancer.  Prostate and Testicular Cancer  Depending on your age and overall health, your health care provider may do certain tests to screen for prostate and testicular cancer.  Talk to your health care provider about any symptoms or concerns you have about testicular or prostate cancer.  Skin Cancer  Check your skin from head to toe regularly.  Tell your health care provider about any new moles or changes in moles, especially if: ? There is a change in a mole's size, shape, or color. ? You have a mole that is larger than a pencil eraser.  Always use sunscreen. Apply sunscreen liberally and repeat throughout the day.  Protect yourself by wearing long sleeves, pants, a wide-brimmed hat, and  sunglasses when outside.  What should I know about heart disease, diabetes, and high blood pressure?  If you are 61-54 years of age, have your blood pressure checked every 3-5 years. If you are 88 years of age or older, have your blood pressure checked every year. You should have your blood pressure measured twice-once when you are at a hospital or clinic, and once when you are not at a hospital or clinic. Record the average of the two measurements. To check your blood pressure when you are not at a hospital  or clinic, you can use: ? An automated blood pressure machine at a pharmacy. ? A home blood pressure monitor.  Talk to your health care provider about your target blood pressure.  If you are between 74-51 years old, ask your health care provider if you should take aspirin to prevent heart disease.  Have regular diabetes screenings by checking your fasting blood sugar level. ? If you are at a normal weight and have a low risk for diabetes, have this test once every three years after the age of 30. ? If you are overweight and have a high risk for diabetes, consider being tested at a younger age or more often.  A one-time screening for abdominal aortic aneurysm (AAA) by ultrasound is recommended for men aged 83-75 years who are current or former smokers. What should I know about preventing infection? Hepatitis B If you have a higher risk for hepatitis B, you should be screened for this virus. Talk with your health care provider to find out if you are at risk for hepatitis B infection. Hepatitis C Blood testing is recommended for:  Everyone born from [birth year] through 1965.  Anyone with known risk factors for hepatitis C.  Sexually Transmitted Diseases (STDs)  You should be screened each year for STDs including gonorrhea and chlamydia if: ? You are sexually active and are younger than 41 years of age. ? You are older than 41 years of age and your health care provider tells you that you are at risk for this type of infection. ? Your sexual activity has changed since you were last screened and you are at an increased risk for chlamydia or gonorrhea. Ask your health care provider if you are at risk.  Talk with your health care provider about whether you are at high risk of being infected with HIV. Your health care provider may recommend a prescription medicine to help prevent HIV infection.  What else can I do?  Schedule regular health, dental, and eye exams.  Stay current with your vaccines  (immunizations).  Do not use any tobacco products, such as cigarettes, chewing tobacco, and e-cigarettes. If you need help quitting, ask your health care provider.  Limit alcohol intake to no more than 2 drinks per day. One drink equals 12 ounces of beer, 5 ounces of wine, or 1 ounces of hard liquor.  Do not use street drugs.  Do not share needles.  Ask your health care provider for help if you need support or information about quitting drugs.  Tell your health care provider if you often feel depressed.  Tell your health care provider if you have ever been abused or do not feel safe at home. This information is not intended to replace advice given to you by your health care provider. Make sure you discuss any questions you have with your health care provider. Document Released: 08/11/2007 Document Revised: 10/12/2015 Document Reviewed: 11/16/2014 Elsevier Interactive Patient Education  2018  [employer].

## 2016-09-26 NOTE — Assessment & Plan Note (Addendum)
Stable off meds The 10-year ASCVD risk score Denman George(Goff DC Jr., et al., 2013) is: 1.4%   Values used to calculate the score:     Age: [age] years     Sex: Male     Is Non-Hispanic African American: No     Diabetic: No     Tobacco smoker: No     Systolic Blood Pressure: 118 mmHg     Is BP treated: No     HDL Cholesterol: 46.3 mg/dL     Total Cholesterol: 226 mg/dL

## 2016-09-26 NOTE — Progress Notes (Signed)
BP 118/82   Pulse 83   Temp 98.8 F (37.1 C) (Oral)   Ht 5' 8.25" (1.734 m)   Wt 184 lb 4 oz (83.6 kg)   SpO2 98%   BMI 27.81 kg/m    CC: CPE Subjective:    Patient ID: Grant [name], male    DOB: 05-16-1975, 41 y.o.   MRN: [id number]  HPI: Grant [name] is a 41 y.o. male presenting on 09/26/2016 for Annual Exam   Takes piracetam 400mg  one tab daily (not available in [place]). Uses this as boost because lexapro causes some sedation.   Ongoing sedation and daytime somnolence. Sleeps on average 6 hrs/night. Asks about replacement for piracetam ?strattera.  Preventative: Flu shot yearly Tdap - 09/11/2016 Hep B series completed Seat belt use discussed Sunscreen use discussed. No changing moles on skin.  Non smoker  Alcohol - rare   Lives with wife Scientist, research (physical sciences)) and daughter First generation immigrant from [place] Occ: Pharmacist at [employer]  Activity: no regular exercise Diet: lots of rice, fruits/vegetables, good water, some juice and tang.   Relevant past medical, surgical, family and social history reviewed and updated as indicated. Interim medical history since our last visit reviewed. Allergies and medications reviewed and updated. Outpatient Medications Prior to Visit  Medication Sig Dispense Refill  . cetirizine (ZYRTEC) 10 MG tablet Take 1 tablet (10 mg total) by mouth daily. 30 tablet 11  . fluticasone (FLONASE) 50 MCG/ACT nasal spray instill 1 spray into each nostril twice a day 16 g 3  . ibuprofen (ADVIL,MOTRIN) 800 MG tablet Take 1 tablet (800 mg total) by mouth every 8 (eight) hours as needed. 30 tablet 0  . ketoconazole (NIZORAL) 2 % shampoo SHAMPOO ONCE TO TWICE DAILY 120 mL 5  . loratadine (CLARITIN) 10 MG tablet Take 10 mg by mouth daily.    Marland Kitchen azithromycin (ZITHROMAX) 250 MG tablet Take two tablets on day one followed by one tablet on days 2-5 6 each 0  . benzonatate (TESSALON) 100 MG capsule Take 100 mg by mouth 3 (three) times daily as needed for  cough.    . escitalopram (LEXAPRO) 5 MG tablet Take 2 tablets (10 mg total) by mouth daily. 60 tablet 6  . HYDROcodone-homatropine (HYCODAN) 5-1.5 MG/5ML syrup Take 5 mLs by mouth 2 (two) times daily as needed for cough (sedation precautions). 140 mL 0  . meclizine (ANTIVERT) 25 MG tablet Take 1 tablet (25 mg total) by mouth 4 (four) times daily as needed. 60 tablet 0  . scopolamine (TRANSDERM-SCOP, 1.5 MG,) 1 MG/3DAYS Place 1 patch (1.5 mg total) onto the skin every 3 (three) days. 10 patch 0   No facility-administered medications prior to visit.      Per HPI unless specifically indicated in ROS section below Review of Systems  Constitutional: Negative for activity change, appetite change, chills, fatigue, fever and unexpected weight change.  HENT: Negative for hearing loss.   Eyes: Negative for visual disturbance.  Respiratory: Negative for cough, chest tightness, shortness of breath and wheezing.   Cardiovascular: Negative for chest pain, palpitations and leg swelling.  Gastrointestinal: Negative for abdominal distention, abdominal pain, blood in stool, constipation, diarrhea, nausea and vomiting.  Genitourinary: Negative for difficulty urinating and hematuria.  Musculoskeletal: Negative for arthralgias, myalgias and neck pain.  Skin: Negative for rash.  Neurological: Positive for dizziness (occasional). Negative for seizures, syncope and headaches.  Hematological: Negative for adenopathy. Does not bruise/bleed easily.  Psychiatric/Behavioral: Negative for dysphoric mood. The patient is not nervous/anxious.  Objective:    BP 118/82   Pulse 83   Temp 98.8 F (37.1 C) (Oral)   Ht 5' 8.25" (1.734 m)   Wt 184 lb 4 oz (83.6 kg)   SpO2 98%   BMI 27.81 kg/m   Wt Readings from Last 3 Encounters:  09/26/16 184 lb 4 oz (83.6 kg)  11/04/15 174 lb (78.9 kg)  05/23/15 172 lb 8 oz (78.2 kg)    Physical Exam  Constitutional: He is oriented to person, place, and time. He appears  well-developed and well-nourished. No distress.  HENT:  Head: Normocephalic and atraumatic.  Right Ear: Hearing, tympanic membrane, external ear and ear canal normal.  Left Ear: Hearing, tympanic membrane, external ear and ear canal normal.  Nose: Nose normal.  Mouth/Throat: Uvula is midline, oropharynx is clear and moist and mucous membranes are normal. No oropharyngeal exudate, posterior oropharyngeal edema or posterior oropharyngeal erythema.  Eyes: Pupils are equal, round, and reactive to light. Conjunctivae and EOM are normal. No scleral icterus.  Neck: Normal range of motion. Neck supple. No thyromegaly present.  Cardiovascular: Normal rate, regular rhythm, normal heart sounds and intact distal pulses.   No murmur heard. Pulses:      Radial pulses are 2+ on the right side, and 2+ on the left side.  Pulmonary/Chest: Effort normal and breath sounds normal. No respiratory distress. He has no wheezes. He has no rales.  Abdominal: Soft. Bowel sounds are normal. He exhibits no distension and no mass. There is no tenderness. There is no rebound and no guarding.  Musculoskeletal: Normal range of motion. He exhibits no edema.  Lymphadenopathy:    He has no cervical adenopathy.  Neurological: He is alert and oriented to person, place, and time.  CN grossly intact, station and gait intact  Skin: Skin is warm and dry. No rash noted.  Psychiatric: He has a normal mood and affect. His behavior is normal. Judgment and thought content normal.  Nursing note and vitals reviewed.  Results for orders placed or performed in visit on 07/27/16  Lipid panel  Result Value Ref Range   Cholesterol 226 (H) 0 - 200 mg/dL   Triglycerides 454.0103.0 0.0 - 149.0 mg/dL   HDL 98.1146.30 >91.47>39.00 mg/dL   VLDL 82.920.6 0.0 - 56.240.0 mg/dL   LDL Cholesterol 130159 (H) 0 - 99 mg/dL   Total CHOL/HDL Ratio 5    NonHDL 179.84   Basic metabolic panel  Result Value Ref Range   Sodium 138 135 - 145 mEq/L   Potassium 4.2 3.5 - 5.1 mEq/L    Chloride 103 96 - 112 mEq/L   CO2 29 19 - 32 mEq/L   Glucose, Bld 110 (H) 70 - 99 mg/dL   BUN 11 6 - 23 mg/dL   Creatinine, Ser 8.650.99 0.40 - 1.50 mg/dL   Calcium 9.5 8.4 - 78.410.5 mg/dL   GFR 69.6288.75 >95.28>60.00 mL/min  TSH  Result Value Ref Range   TSH 3.18 0.35 - 4.50 uIU/mL      Assessment & Plan:   Problem List Items Addressed This Visit    Adjustment disorder with depressed mood    Continue lexapro 5mg  2 tab daily. 10mg  dose was sedating. See below.       Daytime somnolence    Discussed with patient - anticipate due to not getting enough sleep. If he is averaging 7-8 hours and still noticing trouble with this, consider OSA evaluation. rec he try lexapro 5mg  daily in place of 10mg  - he will  continue to trial with med dose changes.       Health maintenance examination - Primary    Preventative protocols reviewed and updated unless pt declined. Discussed healthy diet and lifestyle.       HYPERCHOLESTEROLEMIA    Stable off meds The 10-year ASCVD risk score Denman George(Goff DC Jr., et al., 2013) is: 1.4%   Values used to calculate the score:     Age: [age] years     Sex: Male     Is Non-Hispanic African American: No     Diabetic: No     Tobacco smoker: No     Systolic Blood Pressure: 118 mmHg     Is BP treated: No     HDL Cholesterol: 46.3 mg/dL     Total Cholesterol: 226 mg/dL       Hyperglycemia    Discussed elevated fasting sugar reading - rec avoid added sugars.           Follow up plan: Return in about 1 year (around 09/26/2017), or as needed, for annual exam, prior fasting for blood work.  Eustaquio BoydenJavier Mariaceleste Herrera, MD

## 2016-09-26 NOTE — Assessment & Plan Note (Addendum)
Continue lexapro 5mg  2 tab daily. 10mg  dose was sedating. See below.

## 2016-12-26 ENCOUNTER — Other Ambulatory Visit: Payer: Self-pay | Admitting: Family Medicine

## 2016-12-26 NOTE — Telephone Encounter (Signed)
Last OV (CPE); 09/26/16

## 2016-12-26 NOTE — Telephone Encounter (Signed)
Sent to pharmacy 

## 2017-06-17 ENCOUNTER — Telehealth: Payer: Self-pay | Admitting: Family Medicine

## 2017-06-17 NOTE — Telephone Encounter (Signed)
Copied from CRM [id number]. Topic: Quick Communication - See Telephone Encounter >> Jun 17, 2017  3:30 PM Raquel SarnaHayes, Teresa G wrote: Pt returning call back to speak with a nurse about type of hernia pt is needing an appt for.

## 2017-06-18 NOTE — Telephone Encounter (Signed)
Left VM to call back; requesting information

## 2017-06-18 NOTE — Telephone Encounter (Signed)
Patient called on home and cell numbers listed in chart, left VM to return call to give clarification on the call he made to the office on 06/17/17.

## 2017-06-21 ENCOUNTER — Ambulatory Visit: Payer: 59 | Admitting: Family Medicine

## 2017-06-21 ENCOUNTER — Encounter: Payer: Self-pay | Admitting: Family Medicine

## 2017-06-21 VITALS — BP 122/84 | HR 79 | Temp 98.1°F | Ht 68.25 in | Wt 186.5 lb

## 2017-06-21 DIAGNOSIS — R1909 Other intra-abdominal and pelvic swelling, mass and lump: Secondary | ICD-10-CM | POA: Insufficient documentation

## 2017-06-21 MED ORDER — AMOXICILLIN-POT CLAVULANATE 875-125 MG PO TABS: 1.0000 | ORAL_TABLET | Freq: Two times a day (BID) | ORAL | 0 refills | Status: AC

## 2017-06-21 NOTE — Progress Notes (Signed)
BP 122/84 (BP Location: Left Arm, Patient Position: Sitting, Cuff Size: Normal)   Pulse 79   Temp 98.1 F (36.7 C) (Oral)   Ht 5' 8.25" (1.734 m)   Wt 186 lb 8 oz (84.6 kg)   SpO2 98%   BMI 28.15 kg/m    CC: abd pain Subjective:    Patient ID: Grant [name], male    DOB: [birth year], 42 y.o.   MRN: [id number]  HPI: Dyami Umbach is a 42 y.o. male presenting on 06/21/2017 for Abdominal Pain (Located in umbilical region. Started last week. Pain was first constant. But now pain occurs with certain movement or when area is touched.  Ibuprofen helps. )   1-2 mo h/o periumbilical pain, associated with mild induration around umbilicus, acutely worse this past week. Worse pain with bending forward. Noticing some swelling as well on right side of umbilicus. No streaking erythema.   Treating with ibuprofen with some benefit.   No fevers/chills, nausea/vomiting, diarrhea or bowel changes. No GERD, indigestion.   Relevant past medical, surgical, family and social history reviewed and updated as indicated. Interim medical history since our last visit reviewed. Allergies and medications reviewed and updated. Outpatient Medications Prior to Visit  Medication Sig Dispense Refill  . cetirizine (ZYRTEC) 10 MG tablet Take 1 tablet (10 mg total) by mouth daily. 30 tablet 11  . fluticasone (FLONASE) 50 MCG/ACT nasal spray instill 1 spray into each nostril twice a day 16 g 3  . ibuprofen (ADVIL,MOTRIN) 800 MG tablet Take 1 tablet (800 mg total) by mouth every 8 (eight) hours as needed. 30 tablet 0  . ketoconazole (NIZORAL) 2 % shampoo SHAMPOO ONCE TO TWICE DAILY 120 mL 5  . loratadine (CLARITIN) 10 MG tablet Take 10 mg by mouth daily.    . TRANSDERM-SCOP, 1.5 MG, 1 MG/3DAYS PLACE 1 PATCH ONTO THE SKIN EVERY 3 DAYS as directed 10 patch 0  . escitalopram (LEXAPRO) 5 MG tablet Take 2 tablets (10 mg total) by mouth daily. 180 tablet 3   No facility-administered medications prior to visit.      Per  HPI unless specifically indicated in ROS section below Review of Systems     Objective:    BP 122/84 (BP Location: Left Arm, Patient Position: Sitting, Cuff Size: Normal)   Pulse 79   Temp 98.1 F (36.7 C) (Oral)   Ht 5' 8.25" (1.734 m)   Wt 186 lb 8 oz (84.6 kg)   SpO2 98%   BMI 28.15 kg/m   Wt Readings from Last 3 Encounters:  06/21/17 186 lb 8 oz (84.6 kg)  09/26/16 184 lb 4 oz (83.6 kg)  11/04/15 174 lb (78.9 kg)    Physical Exam  Constitutional: He appears well-developed and well-nourished. He does not appear ill. No distress.  Abdominal: Soft. Normal appearance and bowel sounds are normal. He exhibits no ascites. There is no hepatosplenomegaly. There is tenderness in the periumbilical area. There is no rebound, no guarding, no CVA tenderness and negative Murphy's sign.  Induration to R side of umbilicus with erythema, no fluctuance, tender to palpation  Nursing note and vitals reviewed.     Assessment & Plan:   Problem List Items Addressed This Visit    Umbilical swelling - Primary    R umbilical swelling suspicious for omphalitis - Rx augmentin, ibuprofen, warm compresses. No fluctuance or drainage (nothing to culture or I&D). Update if not improving with treatment to check CT abd r/o umbilical hernia, mass, other. Pt agrees with  plan.           Meds ordered this encounter  Medications  . amoxicillin-clavulanate (AUGMENTIN) 875-125 MG tablet    Sig: Take 1 tablet by mouth 2 (two) times daily for 10 days.    Dispense:  20 tablet    Refill:  0   No orders of the defined types were placed in this encounter.   Follow up plan: No follow-ups on file.  Eustaquio BoydenJavier Lloyd Ayo, MD

## 2017-06-21 NOTE — Assessment & Plan Note (Addendum)
R umbilical swelling suspicious for omphalitis - Rx augmentin, ibuprofen, warm compresses. No fluctuance or drainage (nothing to culture or I&D). Update if not improving with treatment to check CT abd r/o umbilical hernia, mass, other. Pt agrees with plan.

## 2017-06-21 NOTE — Patient Instructions (Addendum)
I think you have infection of umbilicus (omphalitis). Treat with augmentin antibiotic. Ibuprofen also sent to your pharmacy.  Warm compresses. Update us on Monday with how you're doing. If not improving, we will check CT scan.  If worsening, seek urgent care over weekend.

## 2017-06-22 ENCOUNTER — Other Ambulatory Visit: Payer: Self-pay | Admitting: Family Medicine

## 2017-06-24 NOTE — Telephone Encounter (Signed)
No longer a patient @ [facility].

## 2017-06-25 MED ORDER — IBUPROFEN 800 MG PO TABS: 800.0000 mg | ORAL_TABLET | Freq: Three times a day (TID) | ORAL | 3 refills | Status: DC | PRN

## 2017-06-25 NOTE — Telephone Encounter (Signed)
refilled 

## 2017-07-02 ENCOUNTER — Encounter: Payer: Self-pay | Admitting: Family Medicine

## 2017-07-04 ENCOUNTER — Encounter: Payer: Self-pay | Admitting: *Deleted

## 2017-07-04 ENCOUNTER — Telehealth: Payer: Self-pay | Admitting: Family Medicine

## 2017-07-04 NOTE — Telephone Encounter (Signed)
Relation to pt: self Call back number:[phone] :  Reason for call:   Patient returning call, please advise

## 2017-07-04 NOTE — Telephone Encounter (Signed)
Patient's symptoms are not worse but just not gone away and when he stands through the day at work, it is painful.  There seems to be a knot under his umbilicus, almost causing an "outy".  When the patient lays down, it is not painful or bothersome.  No nausea, vomiting, diarrhea, fever.  Appointment offered Friday, May 10 but patient wishes to be scheduled on Monday, May 13.  Appointment scheduled.

## 2017-07-04 NOTE — Telephone Encounter (Signed)
Call patient.  See mychart message.  See about current level of symptoms and let me know- if worse then notify me and add on to the schedule if needed.  Thanks.

## 2017-07-04 NOTE — Telephone Encounter (Signed)
Noted. Thanks.

## 2017-07-08 ENCOUNTER — Encounter: Payer: Self-pay | Admitting: Family Medicine

## 2017-07-08 ENCOUNTER — Ambulatory Visit: Payer: 59 | Admitting: Family Medicine

## 2017-07-08 VITALS — BP 118/78 | HR 76 | Temp 98.7°F | Ht 68.25 in | Wt 190.2 lb

## 2017-07-08 DIAGNOSIS — R1909 Other intra-abdominal and pelvic swelling, mass and lump: Secondary | ICD-10-CM | POA: Diagnosis not present

## 2017-07-08 NOTE — Progress Notes (Signed)
Prev seen by PCP re: umbilical changes.  Treated with augmentin.  It got some better, with ibuprofen prn.  Swelling got better.  Still with some swelling but not red, it isn't back to normal yet.  No FCNAVD.  He doesn't feel unwell.  Can be ttp, sometimes more sore superiorly at the end of the day.  occ ttp on the R side, with laughing or leaning over.  No dysuria.  Normal BM. No nausea. He didn't recall anything that he could "push back in" at the lesion.    Meds, vitals, and allergies reviewed.   ROS: Per HPI unless specifically indicated in ROS section   nad Neck supple, no LA rrr abd soft, possible small R sided umbilical hernia noted.  Minimally ttp.  No erythema. abd soft, not ttp o/w.  Normal BS Ext w/o edema

## 2017-07-08 NOTE — Patient Instructions (Signed)
We will call about your referral.  Shirlee Limerick or Alvina Chou will call you if you don't see one of them on the way out.  Take care.  Glad to see you.  Update [place] as needed.

## 2017-07-09 DIAGNOSIS — R1909 Other intra-abdominal and pelvic swelling, mass and lump: Secondary | ICD-10-CM | POA: Insufficient documentation

## 2017-07-17 DIAGNOSIS — K429 Umbilical hernia without obstruction or gangrene: Secondary | ICD-10-CM | POA: Diagnosis not present

## 2017-12-16 ENCOUNTER — Ambulatory Visit: Payer: 59 | Admitting: Family Medicine

## 2017-12-16 ENCOUNTER — Encounter: Payer: Self-pay | Admitting: Family Medicine

## 2017-12-16 VITALS — BP 120/80 | HR 89 | Temp 98.2°F | Ht 68.25 in | Wt 196.2 lb

## 2017-12-16 DIAGNOSIS — S0992XA Unspecified injury of nose, initial encounter: Secondary | ICD-10-CM

## 2017-12-16 NOTE — Assessment & Plan Note (Addendum)
Suffered direct fall onto nose, with residual swelling without significant bruising. No signs of septal hematoma today. Anticipate nasal injury. Don't think he needs imaging at this time. Return precautions reviewed. rec ice, ibuprofen, time. Update if not improving as expected, low threshold for maxillofacial CT. Pt agrees with plan.

## 2017-12-16 NOTE — Patient Instructions (Addendum)
Recommend ice (covered in a towel, 2-3 times a day), elevation of head as able, may continue ibuprofen/tylenol as needed for discomfort. Should improve over time.  Let us know if persistent or worsening pain to left of nose for imaging study.

## 2017-12-16 NOTE — Progress Notes (Signed)
BP 120/80 (BP Location: Left Arm, Patient Position: Sitting, Cuff Size: Normal)   Pulse 89   Temp 98.2 F (36.8 C) (Oral)   Ht 5' 8.25" (1.734 m)   Wt 196 lb 4 oz (89 kg)   SpO2 97%   BMI 29.62 kg/m    CC: facial injury Subjective:    Patient ID: Grant [name], male    DOB: August 18, 1975, 42 y.o.   MRN: [id number]  HPI: Neill Jurewicz is a 42 y.o. male presenting on 12/16/2017 for Facial Injury (C/o swollen nose due to a fall on 12/14/17. Has slight discomfort now and the swelling is somewhat affecting his breathing. )   DOI: 12/14/2017 While showering in bath tub of shower he slipped and fell onto toilet seat, landed right on nose. Initially nose bled and swelled up. Treated with cold shower, using ibuprofen. Residual soreness, swelling, congested breathing.   Doesn't think there was LOC.  No other head injury.  Denies headache, dizziness, confusion, vision changes.  No prior facial injury.   Relevant past medical, surgical, family and social history reviewed and updated as indicated. Interim medical history since our last visit reviewed. Allergies and medications reviewed and updated. Outpatient Medications Prior to Visit  Medication Sig Dispense Refill  . cetirizine (ZYRTEC) 10 MG tablet Take 1 tablet (10 mg total) by mouth daily. (Patient taking differently: Take 10 mg by mouth daily as needed. ) 30 tablet 11  . ibuprofen (ADVIL,MOTRIN) 800 MG tablet Take 1 tablet (800 mg total) by mouth every 8 (eight) hours as needed. 30 tablet 3  . ketoconazole (NIZORAL) 2 % shampoo SHAMPOO ONCE TO TWICE DAILY 120 mL 5  . loratadine (CLARITIN) 10 MG tablet Take 10 mg by mouth daily as needed.     . fluticasone (FLONASE) 50 MCG/ACT nasal spray instill 1 spray into each nostril twice a day 16 g 3  . TRANSDERM-SCOP, 1.5 MG, 1 MG/3DAYS PLACE 1 PATCH ONTO THE SKIN EVERY 3 DAYS as directed 10 patch 0   No facility-administered medications prior to visit.      Per HPI unless specifically  indicated in ROS section below Review of Systems     Objective:    BP 120/80 (BP Location: Left Arm, Patient Position: Sitting, Cuff Size: Normal)   Pulse 89   Temp 98.2 F (36.8 C) (Oral)   Ht 5' 8.25" (1.734 m)   Wt 196 lb 4 oz (89 kg)   SpO2 97%   BMI 29.62 kg/m   Wt Readings from Last 3 Encounters:  12/16/17 196 lb 4 oz (89 kg)  07/08/17 190 lb 4 oz (86.3 kg)  06/21/17 186 lb 8 oz (84.6 kg)    Physical Exam  Constitutional: He appears well-developed and well-nourished. No distress.  HENT:  Nose: Nose normal. No nasal deformity, septal deviation or nasal septal hematoma.  Tender at tip of bone of nasal bridge as well as L nasal cheek Swelling around nasal bridge No pain along orbits or rest of maxilla Dry blood L>R medially without evidence of hematoma No residual bleeding  Nursing note and vitals reviewed.     Assessment & Plan:   Problem List Items Addressed This Visit    Nasal injury, initial encounter - Primary    Suffered direct fall onto nose, with residual swelling without significant bruising. No signs of septal hematoma today. Anticipate nasal injury. Don't think he needs imaging at this time. Return precautions reviewed. rec ice, ibuprofen, time. Update if not improving as expected,  low threshold for maxillofacial CT. Pt agrees with plan.           No orders of the defined types were placed in this encounter.  No orders of the defined types were placed in this encounter.   Follow up plan: No follow-ups on file.  Eustaquio Boyden, MD

## 2020-01-04 ENCOUNTER — Ambulatory Visit (INDEPENDENT_AMBULATORY_CARE_PROVIDER_SITE_OTHER): Payer: 59 | Admitting: Family Medicine

## 2020-01-04 ENCOUNTER — Ambulatory Visit (INDEPENDENT_AMBULATORY_CARE_PROVIDER_SITE_OTHER)
Admission: RE | Admit: 2020-01-04 | Discharge: 2020-01-04 | Disposition: A | Discharge status: Home / Self Care | Payer: 59 | Source: Ambulatory Visit | Attending: Family Medicine | Admitting: Family Medicine

## 2020-01-04 ENCOUNTER — Other Ambulatory Visit: Payer: Self-pay

## 2020-01-04 ENCOUNTER — Encounter: Payer: Self-pay | Admitting: Family Medicine

## 2020-01-04 VITALS — BP 108/70 | HR 106 | Temp 97.4°F | Ht 68.27 in | Wt 181.8 lb

## 2020-01-04 DIAGNOSIS — L601 Onycholysis: Secondary | ICD-10-CM | POA: Insufficient documentation

## 2020-01-04 DIAGNOSIS — M79672 Pain in left foot: Secondary | ICD-10-CM | POA: Diagnosis not present

## 2020-01-04 DIAGNOSIS — L609 Nail disorder, unspecified: Secondary | ICD-10-CM | POA: Diagnosis not present

## 2020-01-04 NOTE — Patient Instructions (Addendum)
Left heel xray today. I think you may have plantar fasciitis - treat with continued heel lift gel insert to shoe, ibuprofen, frozen water bottle massage, and heel stretching.  Let us know if not improving with this for referral to podiatry.   For nails - possible beginnings of fungal nail infection - treat with biotin hair and nail vitamin as well as funginail lacquer to nail daily, let me know how this helps.

## 2020-01-04 NOTE — Assessment & Plan Note (Signed)
Possible beginnings of onychomycosis with somewhat brittle nail - suggested trial of biotin as well as funginail, update with effect. No need for oral antifungal at this time.

## 2020-01-04 NOTE — Assessment & Plan Note (Addendum)
Story most consistent with plantar fasciitis however he has point tenderness to base of calcaneus as well as positive calc squeeze test - check films to eval for fx. If normal, treat as plantar fasciitis with NSAID, heel lift, fascial stretch, and frozen water bottle massage. Update if no better to consider podiatry eval.  Xray - no obvious fracture or heel spur noted.

## 2020-01-04 NOTE — Progress Notes (Signed)
This visit was conducted in person.  BP 108/70 (BP Location: Left Arm, Patient Position: Sitting)   Pulse (!) 106   Temp (!) 97.4 F (36.3 C)   Ht 5' 8.27" (1.734 m)   Wt 181 lb 12.8 oz (82.5 kg)   SpO2 100%   BMI 27.43 kg/m     Pulse Readings from Last 3 Encounters:  01/04/20 (!) 106  12/16/17 89  07/08/17 76    CC: bilat foot pain, nail fungus Subjective:    Patient ID: Grant [name], male    DOB: January 13, 1976, 44 y.o.   MRN: [id number]  HPI: Grant [name] is a 44 y.o. male presenting on 01/04/2020 for Foot Pain (pt c/o bilateral foot pain and nail fungus on big toes)   Pulse elevation noted.   Bilateral foot pain, L>R heel with burning. Acutely worse the past few months. He's tried stretching, finding better shoes (asics with extra heel cushion or arch support), using Dr Dia Crawford insoles. Denies inciting trauma/injury or fall. Worse first few steps of the day.   Ibuprofen 800 mg intermittently as well as tylenol.   Notes intermittent discoloration to nails. Not tender or itchy.      Relevant past medical, surgical, family and social history reviewed and updated as indicated. Interim medical history since our last visit reviewed. Allergies and medications reviewed and updated. Outpatient Medications Prior to Visit  Medication Sig Dispense Refill  . cetirizine (ZYRTEC) 10 MG tablet Take 1 tablet (10 mg total) by mouth daily. (Patient taking differently: Take 10 mg by mouth daily as needed. ) 30 tablet 11  . ibuprofen (ADVIL,MOTRIN) 800 MG tablet Take 1 tablet (800 mg total) by mouth every 8 (eight) hours as needed. 30 tablet 3  . ketoconazole (NIZORAL) 2 % shampoo SHAMPOO ONCE TO TWICE DAILY 120 mL 5  . loratadine (CLARITIN) 10 MG tablet Take 10 mg by mouth daily as needed.      No facility-administered medications prior to visit.     Per HPI unless specifically indicated in ROS section below Review of Systems Objective:  BP 108/70 (BP Location: Left Arm, Patient  Position: Sitting)   Pulse (!) 106   Temp (!) 97.4 F (36.3 C)   Ht 5' 8.27" (1.734 m)   Wt 181 lb 12.8 oz (82.5 kg)   SpO2 100%   BMI 27.43 kg/m   Wt Readings from Last 3 Encounters:  01/04/20 181 lb 12.8 oz (82.5 kg)  12/16/17 196 lb 4 oz (89 kg)  07/08/17 190 lb 4 oz (86.3 kg)      Physical Exam Vitals and nursing note reviewed.  Constitutional:      Appearance: Normal appearance. He is not ill-appearing.  Musculoskeletal:        General: Tenderness (point tender to left heel) present. No swelling or signs of injury. Normal range of motion.     Right lower leg: No edema.     Left lower leg: No edema.     Comments:  2+ DP bilaterally  Positive calcaneal squeeze test on left Point tender to palpation at mid left heel No pain at achilles tendon or posterior heel No ligament laxity with testing ankle ligaments  Skin:    General: Skin is warm and dry.     Findings: No rash.  Neurological:     Mental Status: He is alert.  Psychiatric:        Mood and Affect: Mood normal.        Behavior: Behavior normal.  Results for orders placed or performed in visit on 07/27/16  Lipid panel  Result Value Ref Range   Cholesterol 226 (H) 0 - 200 mg/dL   Triglycerides 960.4 0 - 149 mg/dL   HDL 54.09 >81.19 mg/dL   VLDL 14.7 0.0 - 82.9 mg/dL   LDL Cholesterol 562 (H) 0 - 99 mg/dL   Total CHOL/HDL Ratio 5    NonHDL 179.84   Basic metabolic panel  Result Value Ref Range   Sodium 138 135 - 145 mEq/L   Potassium 4.2 3.5 - 5.1 mEq/L   Chloride 103 96 - 112 mEq/L   CO2 29 19 - 32 mEq/L   Glucose, Bld 110 (H) 70 - 99 mg/dL   BUN 11 6 - 23 mg/dL   Creatinine, Ser 1.30 0.40 - 1.50 mg/dL   Calcium 9.5 8.4 - 86.5 mg/dL   GFR 78.46 >96.29 mL/min  TSH  Result Value Ref Range   TSH 3.18 0.35 - 4.50 uIU/mL   Assessment & Plan:  This visit occurred during the SARS-CoV-2 public health emergency.  Safety protocols were in place, including screening questions prior to the visit,  additional usage of staff PPE, and extensive cleaning of exam room while observing appropriate contact time as indicated for disinfecting solutions.   Problem List Items Addressed This Visit    Pain of left heel - Primary    Story most consistent with plantar fasciitis however he has point tenderness to base of calcaneus as well as positive calc squeeze test - check films to eval for fx. If normal, treat as plantar fasciitis with NSAID, heel lift, fascial stretch, and frozen water bottle massage. Update if no better to consider podiatry eval.  Xray - no obvious fracture or heel spur noted.       Relevant Orders   DG Foot Complete Left   Nail abnormality    Possible beginnings of onychomycosis with somewhat brittle nail - suggested trial of biotin as well as funginail, update with effect. No need for oral antifungal at this time.           No orders of the defined types were placed in this encounter.  Orders Placed This Encounter  Procedures  . DG Foot Complete Left    Standing Status:   Future    Number of Occurrences:   1    Standing Expiration Date:   01/03/2021    Order Specific Question:   Reason for Exam (SYMPTOM  OR DIAGNOSIS REQUIRED)    Answer:   left heel pain for months    Order Specific Question:   Preferred imaging location?    Answer:   [facility]    Patient Instructions  Left heel xray today. I think you may have plantar fasciitis - treat with continued heel lift gel insert to shoe, ibuprofen, frozen water bottle massage, and heel stretching.  Let us know if not improving with this for referral to podiatry.   For nails - possible beginnings of fungal nail infection - treat with biotin hair and nail vitamin as well as funginail lacquer to nail daily, let me know how this helps.    Follow up plan: Return if symptoms worsen or fail to improve.  Eustaquio Boyden, MD

## 2020-07-19 ENCOUNTER — Telehealth: Payer: Self-pay | Admitting: Family Medicine

## 2020-07-19 ENCOUNTER — Encounter: Payer: Self-pay | Admitting: Family Medicine

## 2020-07-19 NOTE — Telephone Encounter (Signed)
plz schedule OV for tomorrow ok to do in person.

## 2020-07-19 NOTE — Telephone Encounter (Signed)
Called patient to schedule. Left vm for him to call our office to schedule. EM

## 2020-07-19 NOTE — Telephone Encounter (Signed)
Ok for OV or does pt need virtual visit?

## 2020-07-20 NOTE — Telephone Encounter (Signed)
Noted  

## 2020-08-08 ENCOUNTER — Telehealth: Payer: 59 | Admitting: Physician Assistant

## 2020-08-08 ENCOUNTER — Encounter: Payer: Self-pay | Admitting: Physician Assistant

## 2020-08-08 DIAGNOSIS — U071 COVID-19: Secondary | ICD-10-CM

## 2020-08-08 MED ORDER — MOLNUPIRAVIR EUA 200MG CAPSULE: 4.0000 | ORAL_CAPSULE | Freq: Two times a day (BID) | ORAL | 0 refills | Status: AC

## 2020-08-08 MED ORDER — PSEUDOEPH-BROMPHEN-DM 30-2-10 MG/5ML PO SYRP: 5.0000 mL | ORAL_SOLUTION | Freq: Four times a day (QID) | ORAL | 0 refills | Status: DC | PRN

## 2020-08-08 NOTE — Patient Instructions (Signed)
Hello Grant [name],  You are being placed in the home monitoring program for COVID-19 (commonly known as Coronavirus).  This is because you are suspected to have the virus or are known to have the virus.  If you are unsure which group you fall into call your clinic.    As part of this program, you'll answer a daily questionnaire in the MyChart mobile app. You'll receive a notification through the MyChart app when the questionnaire is available. When you log in to MyChart, you'll see the tasks in your To Do activity.       Clinicians will see any answers that are concerning and take appropriate steps.  If at any point you are having a medical emergency, call 911.  If otherwise concerned call your clinic instead of coming into the clinic or hospital.  To keep from spreading the disease you should: Stay home and limit contact with other people as much as possible.  Wash your hands frequently. Cover your coughs and sneezes with a tissue, and throw used tissues in the trash.   Clean and disinfect frequently touched surfaces and objects.    Take care of yourself by: Staying home Resting Drinking fluids Take fever-reducing medications (Tylenol/Acetaminophen and Ibuprofen)  For more information on the disease go to the Centers for Disease Control and Prevention website     You are being prescribed MOLNUPIRAVIR for COVID-19 infection.   Please pick up your prescription at: [facility] pharmacy called intoS [address]   Please call the pharmacy or go through the drive through vs going inside if you are picking up the mediation yourself to prevent further spread. If prescribed to a [facility] affiliated pharmacy, a pharmacist will bring the medication out to your car.   ADMINISTRATION INSTRUCTIONS: Take with or without food. Swallow the tablets whole. Don't chew, crush, or break the medications because it might not work as well  For each dose of the medication, you should be  taking FOUR tablets at one time, TWICE a day   Finish your full five-day course of Molnupiravir even if you feel better before you're done. Stopping this medication too early can make it less effective to prevent severe illness related to COVID19.    Molnupiravir is prescribed for YOU ONLY. Don't share it with others, even if they have similar symptoms as you. This medication might not be right for everyone.   Make sure to take steps to protect yourself and others while you're taking this medication in order to get well soon and to prevent others from getting sick with COVID-19.   **If you are of childbearing potential (any gender) - it is advised to not get pregnant while taking this medication and recommended that condoms are used for male partners the next 3 months after taking the medication out of extreme caution    COMMON SIDE EFFECTS: Diarrhea Nausea  Dizziness    If your COVID-19 symptoms get worse, get medical help right away. Call 911 if you experience symptoms such as worsening cough, trouble breathing, chest pain that doesn't go away, confusion, a hard time staying awake, and pale or blue-colored skin. This medication won't prevent all COVID-19 cases from getting worse.   Can take to lessen severity: Vit C 500mg  twice daily Quercertin 250-500mg  twice daily Zinc 75-100mg  daily Melatonin 3-6 mg at bedtime Vit D3 1000-2000 IU daily Aspirin 81 mg daily with food Optional: Famotidine 20mg  daily Also can add tylenol/ibuprofen as needed for fevers  and body aches May add Mucinex or Mucinex DM as needed for cough/congestion  10 Things You Can Do to Manage Your COVID-19 Symptoms at Home If you have possible or confirmed COVID-19 Stay home except to get medical care. Monitor your symptoms carefully. If your symptoms get worse, call your healthcare provider immediately. Get rest and stay hydrated. If you have a medical appointment, call the healthcare provider ahead of time and  tell them that you have or may have COVID-19. For medical emergencies, call 911 and notify the dispatch personnel that you have or may have COVID-19. Cover your cough and sneezes with a tissue or use the inside of your elbow. Wash your hands often with soap and water for at least 20 seconds or clean your hands with an alcohol-based hand sanitizer that contains at least 60% alcohol. As much as possible, stay in a specific room and away from other people in your home. Also, you should use a separate bathroom, if available. If you need to be around other people in or outside of the home, wear a mask. Avoid sharing personal items with other people in your household, like dishes, towels, and bedding. Clean all surfaces that are touched often, like counters, tabletops, and doorknobs. Use household cleaning sprays or wipes according to the label instructions. [email or web address] 09/11/2019 This information is not intended to replace advice given to you by your health care provider. Make sure you discuss any questions you have with your healthcare provider. Document Revised: 04/01/2020 Document Reviewed: 04/01/2020 Elsevier Patient Education  2022 Elsevier Inc.   COVID-19: What to Do if You Are Sick CDC has updated isolation and quarantine recommendations for the public, and is revising the CDC website to reflect these changes. These recommendations do not apply to healthcare personnel and do not supersede state, local, tribal, or territorial laws, rules, andregulations. If you have a fever, cough or other symptoms, you might have COVID-19. Most people have mild illness and are able to recover at home. If you are sick: Keep track of your symptoms. If you have an emergency warning sign (including trouble breathing), call 911. Steps to help prevent the spread of COVID-19 if you are sick If you are sick with COVID-19 or think you might have COVID-19, follow the steps below to care for yourself and to help  protect other peoplein your home and community. Stay home except to get medical care Stay home. Most people with COVID-19 have mild illness and can recover at home without medical care. Do not leave your home, except to get medical care. Do not visit public areas. Take care of yourself. Get rest and stay hydrated. Take over-the-counter medicines, such as acetaminophen, to help you feel better. Stay in touch with your doctor. Call before you get medical care. Be sure to get care if you have trouble breathing, or have any other emergency warning signs, or if you think it is an emergency. Avoid public transportation, ride-sharing, or taxis. Separate yourself from other people As much as possible, stay in a specific room and away from other people and pets in your home. If possible, you should use a separate bathroom. If you need to be around other people or animals in oroutside of the home, wear a mask. Tell your close contactsthat they may have been exposed to COVID-19. An infected person can spread COVID-19 starting 48 hours (or 2 days) before the person has any symptoms or tests positive. By letting your close contacts know they may have been  exposed to COVID-19, you are helping to protect everyone. Additional guidance is available for those living in close quarters and shared housing. See COVID-19 and Animals if you have questions about pets. If you are diagnosed with COVID-19, someone from the health department may call you. Answer the call to slow the spread. Monitor your symptoms Symptoms of COVID-19 include fever, cough, or other symptoms. Follow care instructions from your healthcare provider and local health department. Your local health authorities may give instructions on checking your symptoms and reporting information. When to seek emergency medical attention Look for emergency warning signs* for COVID-19. If someone is showing any of these signs, seek emergency medical care  immediately: Trouble breathing Persistent pain or pressure in the chest New confusion Inability to wake or stay awake Pale, gray, or blue-colored skin, lips, or nail beds, depending on skin tone *This list is not all possible symptoms. Please call your medical provider forany other symptoms that are severe or concerning to you. Call 911 or call ahead to your local emergency facility: Notify the operator that you are seeking care for someone who has or may haveCOVID-19. Call ahead before visiting your doctor Call ahead. Many medical visits for routine care are being postponed or done by phone or telemedicine. If you have a medical appointment that cannot be postponed, call your doctor's office, and tell them you have or may have COVID-19. This will help the office protect themselves and other patients. Get tested If you have symptoms of COVID-19, get tested. While waiting for test results, you stay away from others, including staying apart from those living in your household. Self-tests are one of several options for testing for the virus that causes COVID-19 and may be more convenient than laboratory-based tests and point-of-care tests. Ask your healthcare provider or your local health department if you need help interpreting your test results. You can visit your state, tribal, local, and territorial health department's website to look for the latest local information on testing sites. If you are sick, wear a mask over your nose and mouth You should wear a mask over your nose and mouth if you must be around other people or animals, including pets (even at home). You don't need to wear the mask if you are alone. If you can't put on a mask (because of trouble breathing, for example), cover your coughs and sneezes in some other way. Try to stay at least 6 feet away from other people. This will help protect the people around you. Masks should not be placed on young children under age 22 years, anyone  who has trouble breathing, or anyone who is not able to remove the mask without help. Note: During the COVID-19 pandemic, medical grade facemasks are reserved forhealthcare workers and some first responders. Cover your coughs and sneezes Cover your mouth and nose with a tissue when you cough or sneeze. Throw away used tissues in a lined trash can. Immediately wash your hands with soap and water for at least 20 seconds. If soap and water are not available, clean your hands with an alcohol-based hand sanitizer that contains at least 60% alcohol. Clean your hands often Wash your hands often with soap and water for at least 20 seconds. This is especially important after blowing your nose, coughing, or sneezing; going to the bathroom; and before eating or preparing food. Use hand sanitizer if soap and water are not available. Use an alcohol-based hand sanitizer with at least 60% alcohol, covering all surfaces of  your hands and rubbing them together until they feel dry. Soap and water are the best option, especially if hands are visibly dirty. Avoid touching your eyes, nose, and mouth with unwashed hands. Handwashing Tips Avoid sharing personal household items Do not share dishes, drinking glasses, cups, eating utensils, towels, or bedding with other people in your home. Wash these items thoroughly after using them with soap and water or put in the dishwasher. Clean all "high-touch" surfaces every day Clean and disinfect high-touch surfaces in your "sick room" and bathroom; wear disposable gloves. Let someone else clean and disinfect surfaces in common areas, but you should clean your bedroom and bathroom, if possible. If a caregiver or other person needs to clean and disinfect a sick person's bedroom or bathroom, they should do so on an as-needed basis. The caregiver/other person should wear a mask and disposable gloves prior to cleaning. They should wait as long as possible after the person who is sick  has used the bathroom before coming in to clean and use the bathroom. High-touch surfaces include phones, remote controls, counters, tabletops, doorknobs, bathroom fixtures, toilets, keyboards, tablets, and bedside tables. Clean and disinfect areas that may have blood, stool, or body fluids on them. Use household cleaners and disinfectants. Clean the area or item with soap and water or another detergent if it is dirty. Then, use a household disinfectant. Be sure to follow the instructions on the label to ensure safe and effective use of the product. Many products recommend keeping the surface wet for several minutes to ensure germs are killed. Many also recommend precautions such as wearing gloves and making sure you have good ventilation during use of the product. Use a product from [employer] List N: Disinfectants for Coronavirus (COVID-19). Complete Disinfection Guidance When you can be around others after being sick with COVID-19 Deciding when you can be around others is different for different situations. Find out when you can safely end home isolation. For any additional questions about your care,contact your healthcare provider or state or local health department. 02/03/2020 Content source: [facility] ([facility]), Division of Viral Diseases This information is not intended to replace advice given to you by your health care provider. Make sure you discuss any questions you have with your healthcare provider. Document Revised: 04/01/2020 Document Reviewed: 04/01/2020 Elsevier Patient Education  2022 [employer].

## 2020-08-08 NOTE — Progress Notes (Signed)
Mr. Grant [name] are scheduled for a virtual visit with your provider today.    Just as we do with appointments in the office, we must obtain your consent to participate.  Your consent will be active for this visit and any virtual visit you may have with one of our providers in the next 365 days.    If you have a MyChart account, I can also send a copy of this consent to you electronically.  All virtual visits are billed to your insurance company just like a traditional visit in the office.  As this is a virtual visit, video technology does not allow for your provider to perform a traditional examination.  This may limit your provider's ability to fully assess your condition.  If your provider identifies any concerns that need to be evaluated in person or the need to arrange testing such as labs, EKG, etc, we will make arrangements to do so.    Although advances in technology are sophisticated, we cannot ensure that it will always work on either your end or our end.  If the connection with a video visit is poor, we may have to switch to a telephone visit.  With either a video or telephone visit, we are not always able to ensure that we have a secure connection.   I need to obtain your verbal consent now.   Are you willing to proceed with your visit today?   Grant [name] has provided verbal consent on 08/08/2020 for a virtual visit (video or telephone).   Margaretann Loveless, PA-C 08/08/2020  11:54 AM    MyChart Video Visit    Virtual Visit via Video Note   This visit type was conducted due to national recommendations for restrictions regarding the COVID-19 Pandemic (e.g. social distancing) in an effort to limit this patient's exposure and mitigate transmission in our community. This patient is at least at moderate risk for complications without adequate follow up. This format is felt to be most appropriate for this patient at this time. Physical exam was limited by quality of the video and  audio technology used for the visit.   Patient location: Home Provider location: Home office in [place]  I discussed the limitations of evaluation and management by telemedicine and the availability of in person appointments. The patient expressed understanding and agreed to proceed.  Patient: Grant [name]   DOB: October 25, 1975   44 y.o. Male  MRN: [id number] Visit Date: 08/08/2020  Today's healthcare provider: Margaretann Loveless, PA-C   No chief complaint on file.  Subjective    URI  This is a new problem. The current episode started in the past 7 days (Thursday, 08/04/20). The problem has been gradually worsening. Maximum temperature: felt feverish but no temperature taken. Associated symptoms include congestion, coughing (dry, but occasionally can get some discolored mucous up), headaches, rhinorrhea, sinus pain and a sore throat. Pertinent negatives include no diarrhea, nausea or wheezing. Treatments tried: desylm, ibuprofen, tylenol, Mucinex, cetirizine and meclizine, benadryl. The treatment provided mild relief.   Tested positive for Covid 19 today, 08/08/20, on at home covid test  Patient Active Problem List   Diagnosis Date Noted   Pain of left heel 01/04/2020   Nail abnormality 01/04/2020   Nasal injury, initial encounter 12/16/2017   Umbilical mass 07/09/2017   Health maintenance examination 09/26/2016   Daytime somnolence 09/26/2016   Hyperglycemia 09/26/2016   Adjustment disorder with depressed mood 05/24/2015   VERTIGO 10/12/2008   HYPERCHOLESTEROLEMIA 04/30/2007  ALLERGIC RHINITIS 04/25/2007   GERD 04/25/2007   Past Medical History:  Diagnosis Date   Allergy    GERD (gastroesophageal reflux disease)    Hyperlipemia    Post concussive syndrome 03/20/2011      Medications: Outpatient Medications Prior to Visit  Medication Sig   cetirizine (ZYRTEC) 10 MG tablet Take 1 tablet (10 mg total) by mouth daily. (Patient taking differently: Take 10 mg by mouth  daily as needed. )   ibuprofen (ADVIL,MOTRIN) 800 MG tablet Take 1 tablet (800 mg total) by mouth every 8 (eight) hours as needed.   ketoconazole (NIZORAL) 2 % shampoo SHAMPOO ONCE TO TWICE DAILY   loratadine (CLARITIN) 10 MG tablet Take 10 mg by mouth daily as needed.    No facility-administered medications prior to visit.    Review of Systems  Constitutional:  Positive for appetite change (slightly decreased), chills, fatigue (first couple of days, but improving) and fever.  HENT:  Positive for congestion, postnasal drip, rhinorrhea, sinus pressure, sinus pain and sore throat.   Respiratory:  Positive for cough (dry, but occasionally can get some discolored mucous up). Negative for chest tightness, shortness of breath and wheezing.   Cardiovascular: Negative.   Gastrointestinal:  Negative for diarrhea and nausea.  Musculoskeletal:  Positive for myalgias.  Neurological:  Positive for dizziness and headaches.   Last CBC No results found for: WBC, HGB, HCT, MCV, MCH, RDW, PLT Last metabolic panel Lab Results  Component Value Date   GLUCOSE 110 (H) 07/27/2016   NA 138 07/27/2016   K 4.2 07/27/2016   CL 103 07/27/2016   CO2 29 07/27/2016   BUN 11 07/27/2016   CREATININE 0.99 07/27/2016   GFRNONAA 103.38 11/22/2008   GFRAA 112 04/30/2007   CALCIUM 9.5 07/27/2016   PROT 7.2 09/19/2009   ALBUMIN 4.4 09/19/2009   BILITOT 0.9 09/19/2009   ALKPHOS 52 09/19/2009   AST 25 09/19/2009   ALT 44 09/19/2009      Objective    There were no vitals taken for this visit. BP Readings from Last 3 Encounters:  01/04/20 108/70  12/16/17 120/80  07/08/17 118/78   Wt Readings from Last 3 Encounters:  01/04/20 181 lb 12.8 oz (82.5 kg)  12/16/17 196 lb 4 oz (89 kg)  07/08/17 190 lb 4 oz (86.3 kg)      Physical Exam Vitals reviewed.  Constitutional:      General: He is not in acute distress.    Appearance: Normal appearance. He is well-developed. He is not ill-appearing.  HENT:      Head: Normocephalic and atraumatic.  Eyes:     Conjunctiva/sclera: Conjunctivae normal.  Pulmonary:     Effort: Pulmonary effort is normal. No respiratory distress (dry, throat clearing cough heard multiple times through visit, but did not cause any issues for patient to speak in full, coherent sentences).  Musculoskeletal:     Cervical back: Normal range of motion and neck supple.  Neurological:     Mental Status: He is alert.  Psychiatric:        Mood and Affect: Mood normal.        Behavior: Behavior normal.        Thought Content: Thought content normal.        Judgment: Judgment normal.       Assessment & Plan     1. COVID-19 - Continue OTC symptomatic management of choice - Will send OTC vitamins and supplement information through AV[facility] - Molnupiravir provided for patient  -  Bromfed-DM for cough/congestion - Patient enrolled in MyChart symptom monitoring - Push fluids - Rest as needed - Discussed return precautions and when to seek in-person evaluation, sent via [facility] as well - brompheniramine-pseudoephedrine-DM 30-2-10 MG/5ML syrup; Take 5 mLs by mouth 4 (four) times daily as needed.  Dispense: 120 mL; Refill: 0 - molnupiravir EUA 200 mg CAPS; Take 4 capsules (800 mg total) by mouth 2 (two) times daily for 5 days.  Dispense: 40 capsule; Refill: 0 - MyChart COVID-19 home monitoring program; Future - Temperature monitoring; Future   No follow-ups on file.     I discussed the assessment and treatment plan with the patient. The patient was provided an opportunity to ask questions and all were answered. The patient agreed with the plan and demonstrated an understanding of the instructions.   The patient was advised to call back or seek an in-person evaluation if the symptoms worsen or if the condition fails to improve as anticipated.  I provided 15 minutes of face-to-face time during this encounter via MyChart Video enabled encounter.   Reine Just [facility]  Health Telehealth [phone] (phone) [phone] (fax)  [facility]

## 2020-08-10 ENCOUNTER — Encounter: Payer: Self-pay | Admitting: Physician Assistant

## 2021-07-05 ENCOUNTER — Encounter: Payer: Self-pay | Admitting: Family Medicine

## 2021-07-07 MED ORDER — SCOPOLAMINE 1 MG/3DAYS TD PT72: 1.0000 | MEDICATED_PATCH | TRANSDERMAL | 1 refills | Status: DC

## 2021-07-07 NOTE — Telephone Encounter (Signed)
Updated pt's pharmacy list. ? ?Last OV:  01/04/20, L heel pain ?Next OV:  none ?

## 2021-07-07 NOTE — Addendum Note (Signed)
Addended by: Ria Bush on: 07/07/2021 09:37 AM ? ? Modules accepted: Orders ? ?

## 2021-07-27 IMAGING — DX DG FOOT COMPLETE 3+V*L*
3 series · 3 of 3 positions shown · non-contrast
Comparison: None.

CLINICAL DATA: Left heel pain for months

EXAM:
LEFT FOOT - COMPLETE 3+ VIEW

[foot ap]
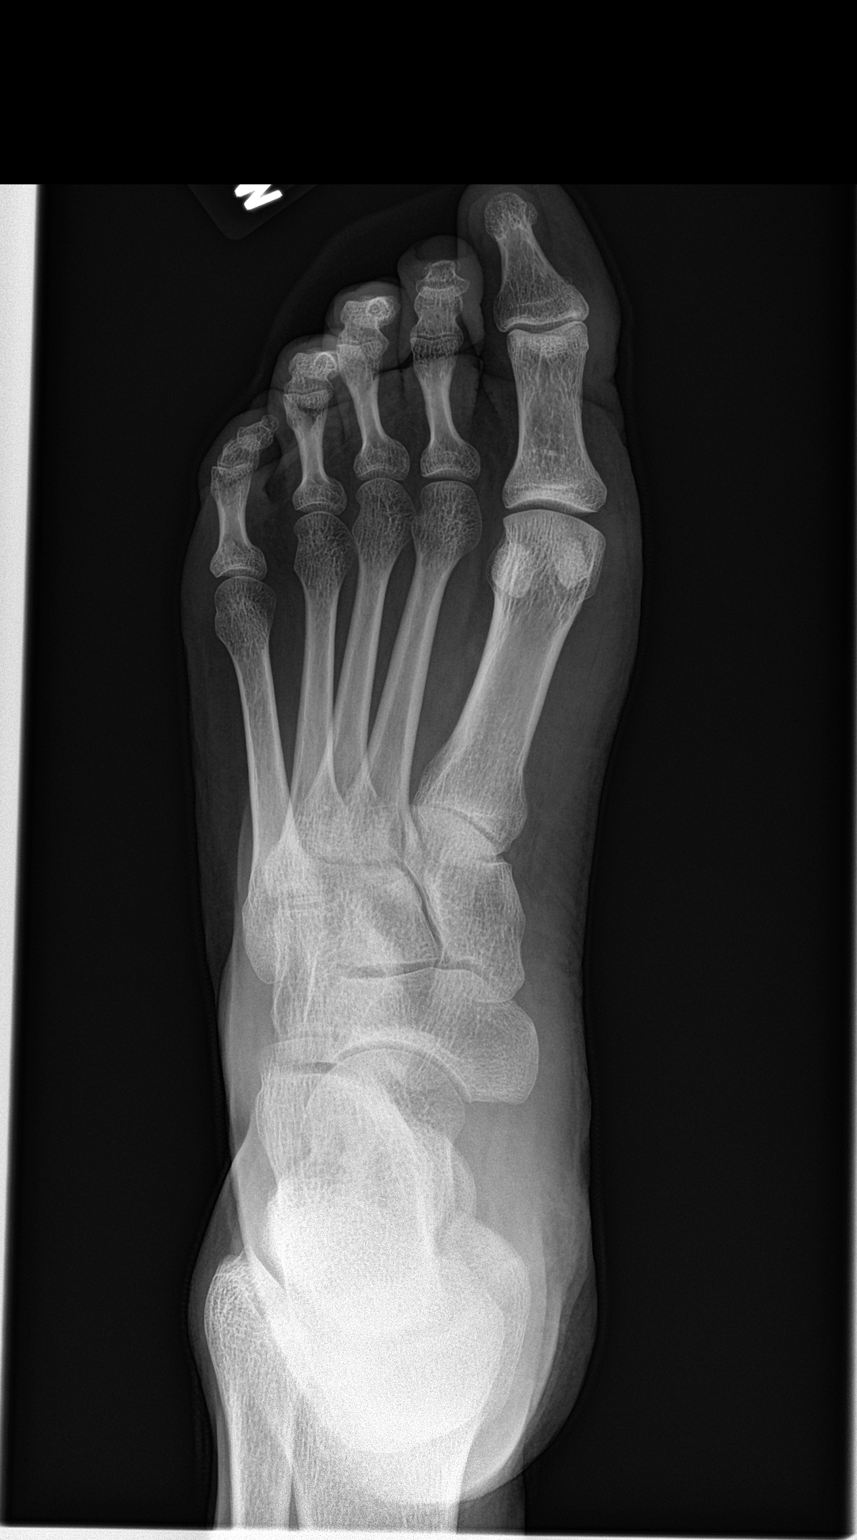

[foot obl]
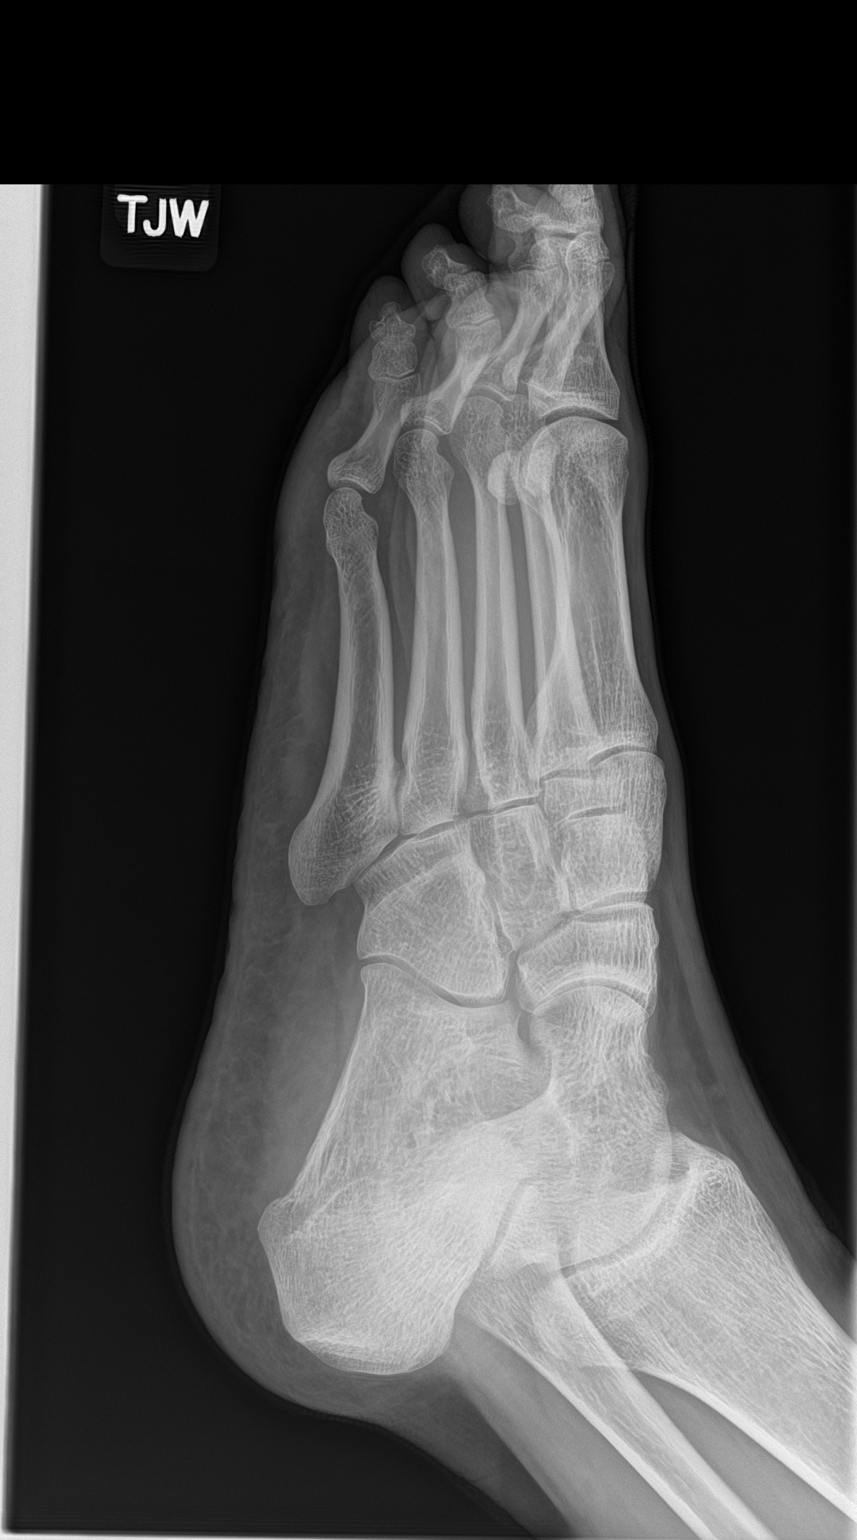

[foot lat]
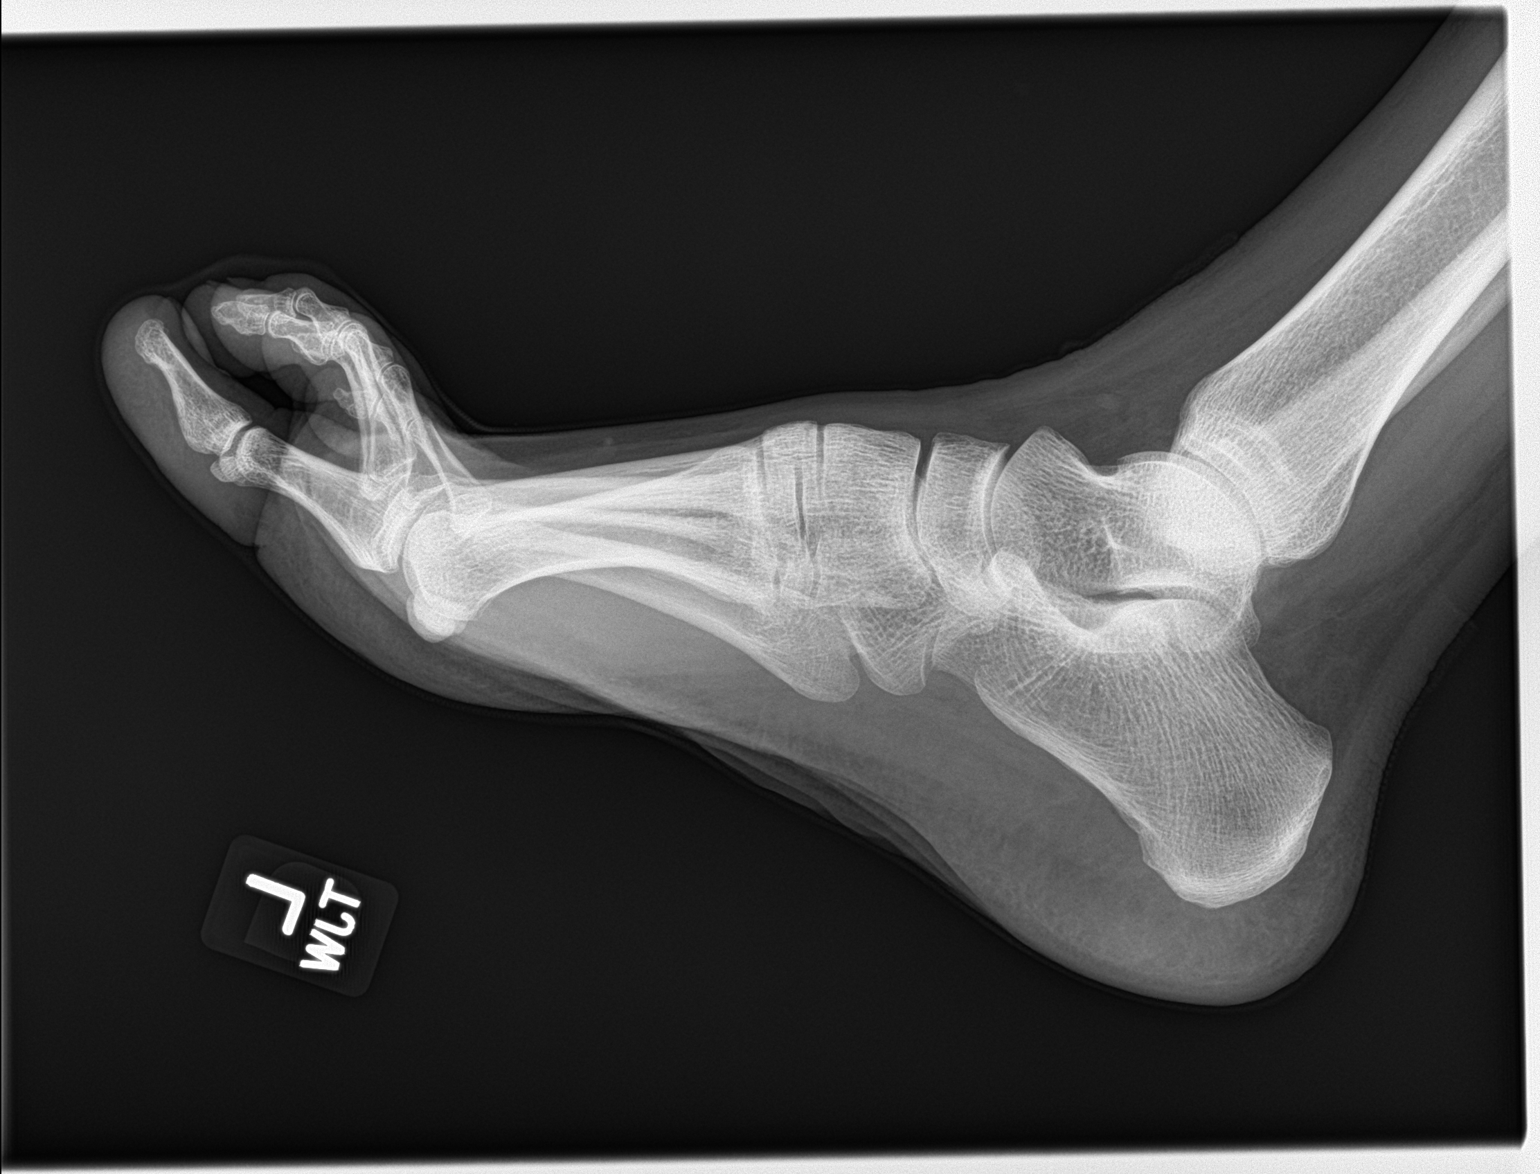

[3 of 3 positions shown; findings below may reference images not displayed]

FINDINGS: Frontal, oblique, lateral views of the left foot are obtained. No
fracture, subluxation, or dislocation. Joint spaces are well
preserved. No joint effusion.
IMPRESSION: 1. Unremarkable left foot.

## 2021-11-17 ENCOUNTER — Ambulatory Visit (INDEPENDENT_AMBULATORY_CARE_PROVIDER_SITE_OTHER): Payer: No Typology Code available for payment source | Admitting: Family Medicine

## 2021-11-17 ENCOUNTER — Encounter: Payer: Self-pay | Admitting: Family Medicine

## 2021-11-17 VITALS — BP 134/84 | HR 100 | Temp 97.6°F | Ht 68.25 in | Wt 197.1 lb

## 2021-11-17 DIAGNOSIS — Z1211 Encounter for screening for malignant neoplasm of colon: Secondary | ICD-10-CM | POA: Diagnosis not present

## 2021-11-17 DIAGNOSIS — R41840 Attention and concentration deficit: Secondary | ICD-10-CM

## 2021-11-17 DIAGNOSIS — E7800 Pure hypercholesterolemia, unspecified: Secondary | ICD-10-CM

## 2021-11-17 DIAGNOSIS — R739 Hyperglycemia, unspecified: Secondary | ICD-10-CM | POA: Diagnosis not present

## 2021-11-17 DIAGNOSIS — Z0000 Encounter for general adult medical examination without abnormal findings: Secondary | ICD-10-CM | POA: Diagnosis not present

## 2021-11-17 DIAGNOSIS — K6289 Other specified diseases of anus and rectum: Secondary | ICD-10-CM

## 2021-11-17 DIAGNOSIS — Z1159 Encounter for screening for other viral diseases: Secondary | ICD-10-CM

## 2021-11-17 MED ORDER — ATOMOXETINE HCL 40 MG PO CAPS: 40.0000 mg | ORAL_CAPSULE | Freq: Every day | ORAL | 3 refills | Status: DC

## 2021-11-17 NOTE — Assessment & Plan Note (Signed)
Preventative protocols reviewed and updated unless pt declined. Discussed healthy diet and lifestyle.  

## 2021-11-17 NOTE — Patient Instructions (Addendum)
Trial strattera sent to pharmacy.  We will refer you for colonoscopy in [place].  Schedule lab visit at your convenience.  Good to see you today.  Return in 1 year for next physical.   Health Maintenance, Male Adopting a healthy lifestyle and getting preventive care are important in promoting health and wellness. Ask your health care provider about: The right schedule for you to have regular tests and exams. Things you can do on your own to prevent diseases and keep yourself healthy. What should I know about diet, weight, and exercise? Eat a healthy diet  Eat a diet that includes plenty of vegetables, fruits, low-fat dairy products, and lean protein. Do not eat a lot of foods that are high in solid fats, added sugars, or sodium. Maintain a healthy weight Body mass index (BMI) is a measurement that can be used to identify possible weight problems. It estimates body fat based on height and weight. Your health care provider can help determine your BMI and help you achieve or maintain a healthy weight. Get regular exercise Get regular exercise. This is one of the most important things you can do for your health. Most adults should: Exercise for at least 150 minutes each week. The exercise should increase your heart rate and make you sweat (moderate-intensity exercise). Do strengthening exercises at least twice a week. This is in addition to the moderate-intensity exercise. Spend less time sitting. Even light physical activity can be beneficial. Watch cholesterol and blood lipids Have your blood tested for lipids and cholesterol at 46 years of age, then have this test every 5 years. You may need to have your cholesterol levels checked more often if: Your lipid or cholesterol levels are high. You are older than 45 years of age. You are at high risk for heart disease. What should I know about cancer screening? Many types of cancers can be detected early and may often be prevented. Depending  on your health history and family history, you may need to have cancer screening at various ages. This may include screening for: Colorectal cancer. Prostate cancer. Skin cancer. Lung cancer. What should I know about heart disease, diabetes, and high blood pressure? Blood pressure and heart disease High blood pressure causes heart disease and increases the risk of stroke. This is more likely to develop in people who have high blood pressure readings or are overweight. Talk with your health care provider about your target blood pressure readings. Have your blood pressure checked: Every 3-5 years if you are 21-23 years of age. Every year if you are 49 years old or older. If you are between the ages of 56 and 87 and are a current or former smoker, ask your health care provider if you should have a one-time screening for abdominal aortic aneurysm (AAA). Diabetes Have regular diabetes screenings. This checks your fasting blood sugar level. Have the screening done: Once every three years after age 34 if you are at a normal weight and have a low risk for diabetes. More often and at a younger age if you are overweight or have a high risk for diabetes. What should I know about preventing infection? Hepatitis B If you have a higher risk for hepatitis B, you should be screened for this virus. Talk with your health care provider to find out if you are at risk for hepatitis B infection. Hepatitis C Blood testing is recommended for: Everyone born from [birth year] through 1965. Anyone with known risk factors for hepatitis C. Sexually transmitted  infections (STIs) You should be screened each year for STIs, including gonorrhea and chlamydia, if: You are sexually active and are younger than 46 years of age. You are older than 46 years of age and your health care provider tells you that you are at risk for this type of infection. Your sexual activity has changed since you were last screened, and you are at  increased risk for chlamydia or gonorrhea. Ask your health care provider if you are at risk. Ask your health care provider about whether you are at high risk for HIV. Your health care provider may recommend a prescription medicine to help prevent HIV infection. If you choose to take medicine to prevent HIV, you should first get tested for HIV. You should then be tested every 3 months for as long as you are taking the medicine. Follow these instructions at home: Alcohol use Do not drink alcohol if your health care provider tells you not to drink. If you drink alcohol: Limit how much you have to 0-2 drinks a day. Know how much alcohol is in your drink. In the [place] one drink equals one 12 oz bottle of beer (355 mL), one 5 oz glass of wine (148 mL), or one 1 oz glass of hard liquor (44 mL). Lifestyle Do not use any products that contain nicotine or tobacco. These products include cigarettes, chewing tobacco, and vaping devices, such as e-cigarettes. If you need help quitting, ask your health care provider. Do not use street drugs. Do not share needles. Ask your health care provider for help if you need support or information about quitting drugs. General instructions Schedule regular health, dental, and eye exams. Stay current with your vaccines. Tell your health care provider if: You often feel depressed. You have ever been abused or do not feel safe at home. Summary Adopting a healthy lifestyle and getting preventive care are important in promoting health and wellness. Follow your health care provider's instructions about healthy diet, exercising, and getting tested or screened for diseases. Follow your health care provider's instructions on monitoring your cholesterol and blood pressure. This information is not intended to replace advice given to you by your health care provider. Make sure you discuss any questions you have with your health care provider. Document Revised: 07/04/2020  Document Reviewed: 07/04/2020 Elsevier Patient Education  [place].

## 2021-11-17 NOTE — Progress Notes (Unsigned)
Patient ID: Grant [name], male    DOB: [birth year], 46 y.o.   MRN: [id number]  This visit was conducted in person.  BP 134/84   Pulse 100   Temp 97.6 F (36.4 C) (Temporal)   Ht 5' 8.25" (1.734 m)   Wt 197 lb 2 oz (89.4 kg)   SpO2 98%   BMI 29.75 kg/m    CC: discuss screening, inattention, CPE Subjective:   HPI: Grant [name] is a 46 y.o. male presenting on 11/17/2021 for Other (Wants to discuss prostate and colon CA screenings.  Denies any sxs. )   Previously on piracetam 400mg  for attention and to combat sedation from lexapro. Has not tried strattera.   Pt endorses persistent pattern of inattention and or hyperactivity/impulsivity that interferes with daily functioning. Symptoms present in 2 or more settings (home, work, studying). Symptoms present before 46 years of age? No - first noted symptoms in HS Inattention (6+, 6 months): Careless mistakes? no Difficulty sustaining attention? yes Doesn't listen when spoken to directly? no Lacks follow through with instructions or difficulty completing tasks? no Difficulty organizing tasks/activities? yes Avoiding tasks that require sustained attention? yes Easily losing things needed for tasks? yes Easily distracted by external stimuli? yes Forgetful in daily activities? yes Hyperactive/impulsive (6+, 6 months): Fidgeting, tapping hands/feet? yes Leaves seat when expected to remain in seat? no Feeling restless, or running around when not expected? yes Unable to engage in leisurely activities quietly? yes Always on the go, "driven by a motor"? yes Excessive talking? no Blurting out answer, responds to other's questions? yes Difficulty waiting in line or waiting turn? yes Interrupting or intruding on others? yes    Preventative: Colon cancer screening - discussed options. Would like colonoscopy. Notes some occasional rectal pain - he does strain. Enjoys spicy peppers. Notes increased bloating. No blood in stool.   Prostate cancer screening - no fmhx. No weakening stream. Nocturia x1.  Flu shot yearly COVID vaccine - Pfizer 03/2019, 05/2019, booster x1  Tdap - 09/11/2016 Hep B series completed Seat belt use discussed Sunscreen use discussed. No changing moles on skin.  Non smoker  Alcohol - rare  Dentist - yearly Eye exam - yearly  Lives with wife Warehouse manager) and daughter Occ: Pharmacist at [employer]  Activity: no regular exercise Diet: lots of rice, fruits/vegetables, good water.      Relevant past medical, surgical, family and social history reviewed and updated as indicated. Interim medical history since our last visit reviewed. Allergies and medications reviewed and updated. Outpatient Medications Prior to Visit  Medication Sig Dispense Refill   cetirizine (ZYRTEC) 10 MG tablet Take 1 tablet (10 mg total) by mouth daily. (Patient taking differently: Take 10 mg by mouth daily as needed.) 30 tablet 11   GINSENG PO Take 1 capsule by mouth daily. Takes 500 mg capsule     Green Tea 250 MG CAPS Take by mouth daily.     loratadine (CLARITIN) 10 MG tablet Take 10 mg by mouth daily as needed.      MISC NATURAL PRODUCTS PO Take by mouth daily. Caralluma     Multiple Vitamins-Minerals (CENTRUM ADULT PO) Take by mouth daily.     Probiotic Product (PROBIOTIC PO) Take by mouth daily.     brompheniramine-pseudoephedrine-DM 30-2-10 MG/5ML syrup Take 5 mLs by mouth 4 (four) times daily as needed. 120 mL 0   ibuprofen (ADVIL,MOTRIN) 800 MG tablet Take 1 tablet (800 mg total) by mouth every 8 (eight) hours as needed. [place]  tablet 3   ketoconazole (NIZORAL) 2 % shampoo SHAMPOO ONCE TO TWICE DAILY 120 mL 5   scopolamine (TRANSDERM-SCOP) 1 MG/3DAYS Place 1 patch (1.5 mg total) onto the skin every 3 (three) days. 4 patch 1   No facility-administered medications prior to visit.     Per HPI unless specifically indicated in ROS section below Review of Systems  Constitutional:  Positive for appetite change  (increased). Negative for activity change, chills, fatigue, fever and unexpected weight change.  HENT:  Negative for hearing loss.   Eyes:  Negative for visual disturbance.  Respiratory:  Negative for cough, chest tightness, shortness of breath and wheezing.   Cardiovascular:  Negative for chest pain, palpitations and leg swelling.  Gastrointestinal:  Positive for abdominal pain (h/o hernia) and constipation (rare). Negative for abdominal distention, blood in stool, diarrhea, nausea and vomiting.       Occ loose stools  Genitourinary:  Negative for difficulty urinating and hematuria.  Musculoskeletal:  Negative for arthralgias, myalgias and neck pain.  Skin:  Negative for rash.  Neurological:  Positive for dizziness. Negative for seizures, syncope and headaches.  Hematological:  Negative for adenopathy. Does not bruise/bleed easily.  Psychiatric/Behavioral:  Negative for dysphoric mood. The patient is not nervous/anxious.     Objective:  BP 134/84   Pulse 100   Temp 97.6 F (36.4 C) (Temporal)   Ht 5' 8.25" (1.734 m)   Wt 197 lb 2 oz (89.4 kg)   SpO2 98%   BMI 29.75 kg/m   Wt Readings from Last 3 Encounters:  11/17/21 197 lb 2 oz (89.4 kg)  01/04/20 181 lb 12.8 oz (82.5 kg)  12/16/17 196 lb 4 oz (89 kg)      Physical Exam Vitals and nursing note reviewed.  Constitutional:      General: He is not in acute distress.    Appearance: Normal appearance. He is well-developed. He is not ill-appearing.  HENT:     Head: Normocephalic and atraumatic.     Right Ear: Hearing, tympanic membrane, ear canal and external ear normal.     Left Ear: Hearing, tympanic membrane, ear canal and external ear normal.  Eyes:     General: No scleral icterus.    Extraocular Movements: Extraocular movements intact.     Conjunctiva/sclera: Conjunctivae normal.     Pupils: Pupils are equal, round, and reactive to light.  Neck:     Thyroid: No thyroid mass or thyromegaly.  Cardiovascular:     Rate and  Rhythm: Normal rate and regular rhythm.     Pulses: Normal pulses.          Radial pulses are 2+ on the right side and 2+ on the left side.     Heart sounds: Normal heart sounds. No murmur heard. Pulmonary:     Effort: Pulmonary effort is normal. No respiratory distress.     Breath sounds: Normal breath sounds. No wheezing, rhonchi or rales.  Abdominal:     General: Bowel sounds are normal. There is no distension.     Palpations: Abdomen is soft. There is no mass.     Tenderness: There is no abdominal tenderness. There is no guarding or rebound.     Hernia: No hernia is present.  Musculoskeletal:        General: Normal range of motion.     Cervical back: Normal range of motion and neck supple.     Right lower leg: No edema.     Left lower leg: No  edema.  Lymphadenopathy:     Cervical: No cervical adenopathy.  Skin:    General: Skin is warm and dry.     Findings: No rash.  Neurological:     General: No focal deficit present.     Mental Status: He is alert and oriented to person, place, and time.  Psychiatric:        Mood and Affect: Mood normal.        Behavior: Behavior normal.        Thought Content: Thought content normal.        Judgment: Judgment normal.       Results for orders placed or performed in visit on 07/27/16  Lipid panel  Result Value Ref Range   Cholesterol 226 (H) 0 - 200 mg/dL   Triglycerides 191.6 0.0 - 149.0 mg/dL   HDL 60.60 >04.59 mg/dL   VLDL 97.7 0.0 - 41.4 mg/dL   LDL Cholesterol 239 (H) 0 - 99 mg/dL   Total CHOL/HDL Ratio 5    NonHDL 179.84   Basic metabolic panel  Result Value Ref Range   Sodium 138 135 - 145 mEq/L   Potassium 4.2 3.5 - 5.1 mEq/L   Chloride 103 96 - 112 mEq/L   CO2 29 19 - 32 mEq/L   Glucose, Bld 110 (H) 70 - 99 mg/dL   BUN 11 6 - 23 mg/dL   Creatinine, Ser 5.32 0.40 - 1.50 mg/dL   Calcium 9.5 8.4 - 02.3 mg/dL   GFR 34.35 >68.61 mL/min  TSH  Result Value Ref Range   TSH 3.18 0.35 - 4.50 uIU/mL    Assessment & Plan:    Problem List Items Addressed This Visit     Health maintenance examination - Primary (Chronic)    Preventative protocols reviewed and updated unless pt declined. Discussed healthy diet and lifestyle.       HYPERCHOLESTEROLEMIA    Update FLP when he returns fasting for labwork.  The ASCVD Risk score (Arnett DK, et al., 2019) failed to calculate for the following reasons:   Cannot find a previous HDL lab   Cannot find a previous total cholesterol lab       Relevant Orders   Lipid panel   Comprehensive metabolic panel   Rectal pain    ?proctalgia fugax. Doubt referred pain from lumbosacral spine. Not consistent with hemorrhoid or anal fissure. Discussed fiber and water intake to avoid constipation/straining. Update if ongoing or worsening.  Will also refer to GI for screening colonoscopy      Hyperglycemia    Update A1c, glu when he returns for fasting bloodwork      Relevant Orders   Hemoglobin A1c   Inattention    Longstanding history of inattention, desires further eval/treatment.  Screens positive for both inattention and hyperactivity.  Discussed treatment options including stimulant and non-stimulant options. If we pursue stimulant route, would recommend formal ADHD testing with psychological evaluation. Desires to try non-stimulant - will start strattera 40mg  daily with option to increase dose if tolerating well. Watch for possible side effects including HA, nausea.  Follow up left open ended - he may message me on MyChart with update on medication effect.       Other Visit Diagnoses     Special screening for malignant neoplasms, colon       Relevant Orders   Ambulatory referral to Gastroenterology   Need for hepatitis C screening test       Relevant Orders   Hepatitis C antibody  Meds ordered this encounter  Medications   atomoxetine (STRATTERA) 40 MG capsule    Sig: Take 1 capsule (40 mg total) by mouth daily.    Dispense:  30 capsule    Refill:  3    Orders Placed This Encounter  Procedures   Lipid panel    Standing Status:   Future    Standing Expiration Date:   11/19/2022   Comprehensive metabolic panel    Standing Status:   Future    Standing Expiration Date:   11/19/2022   Hepatitis C antibody    Standing Status:   Future    Standing Expiration Date:   11/19/2022   Hemoglobin A1c    Standing Status:   Future    Standing Expiration Date:   11/19/2022   Ambulatory referral to Gastroenterology    Referral Priority:   Routine    Referral Type:   Consultation    Referral Reason:   Specialty Services Required    Number of Visits Requested:   1     Patient instructions: Trial strattera sent to pharmacy.  We will refer you for colonoscopy in [place].  Schedule lab visit at your convenience.  Good to see you today.  Return in 1 year for next physical.   Follow up plan: Return in about 1 year (around 11/18/2022) for annual exam, prior fasting for blood work.  Eustaquio BoydenJavier Shakea Isip, MD

## 2021-11-18 DIAGNOSIS — R41840 Attention and concentration deficit: Secondary | ICD-10-CM | POA: Insufficient documentation

## 2021-11-18 NOTE — Assessment & Plan Note (Addendum)
Longstanding history of inattention, desires further eval/treatment.  Screens positive for both inattention and hyperactivity.  Discussed treatment options including stimulant and non-stimulant options. If we pursue stimulant route, would recommend formal ADHD testing with psychological evaluation. Desires to try non-stimulant - will start strattera 40mg  daily with option to increase dose if tolerating well. Watch for possible side effects including HA, nausea.  Follow up left open ended - he may message me on MyChart with update on medication effect.

## 2021-11-18 NOTE — Assessment & Plan Note (Addendum)
Update A1c, glu when he returns for fasting bloodwork

## 2021-11-18 NOTE — Assessment & Plan Note (Addendum)
Update FLP when he returns fasting for labwork.  The ASCVD Risk score (Arnett DK, et al., 2019) failed to calculate for the following reasons:   Cannot find a previous HDL lab   Cannot find a previous total cholesterol lab

## 2021-11-18 NOTE — Assessment & Plan Note (Addendum)
?  proctalgia fugax. Doubt referred pain from lumbosacral spine. Not consistent with hemorrhoid or anal fissure. Discussed fiber and water intake to avoid constipation/straining. Update if ongoing or worsening.  Will also refer to GI for screening colonoscopy

## 2021-11-18 NOTE — Addendum Note (Signed)
Addended by: Ria Bush on: 11/18/2021 10:06 AM   Modules accepted: Orders

## 2021-11-21 ENCOUNTER — Other Ambulatory Visit (INDEPENDENT_AMBULATORY_CARE_PROVIDER_SITE_OTHER): Payer: No Typology Code available for payment source

## 2021-11-21 DIAGNOSIS — Z1159 Encounter for screening for other viral diseases: Secondary | ICD-10-CM

## 2021-11-21 DIAGNOSIS — R739 Hyperglycemia, unspecified: Secondary | ICD-10-CM

## 2021-11-21 DIAGNOSIS — E7800 Pure hypercholesterolemia, unspecified: Secondary | ICD-10-CM

## 2021-11-21 LAB — COMPREHENSIVE METABOLIC PANEL
ALT: 78 U/L — ABNORMAL HIGH (ref 0–53)
AST: 37 U/L (ref 0–37)
Albumin: 4.6 g/dL (ref 3.5–5.2)
Alkaline Phosphatase: 65 U/L (ref 39–117)
BUN: 14 mg/dL (ref 6–23)
CO2: 28 mEq/L (ref 19–32)
Calcium: 9.6 mg/dL (ref 8.4–10.5)
Chloride: 102 mEq/L (ref 96–112)
Creatinine, Ser: 1.1 mg/dL (ref 0.40–1.50)
GFR: 80.88 mL/min (ref >60.00)
Glucose, Bld: 130 mg/dL — ABNORMAL HIGH (ref 70–99)
Potassium: 4.4 mEq/L (ref 3.5–5.1)
Sodium: 137 mEq/L (ref 135–145)
Total Bilirubin: 0.9 mg/dL (ref 0.2–1.2)
Total Protein: 7.5 g/dL (ref 6.0–8.3)

## 2021-11-21 LAB — LIPID PANEL
Cholesterol: 234 mg/dL — ABNORMAL HIGH (ref 0–200)
HDL: 43.3 mg/dL (ref >39.00)
LDL Cholesterol: 170 mg/dL — ABNORMAL HIGH (ref 0–99)
NonHDL: 190.95
Total CHOL/HDL Ratio: 5
Triglycerides: 107 mg/dL (ref 0.0–149.0)
VLDL: 21.4 mg/dL (ref 0.0–40.0)

## 2021-11-21 LAB — HEMOGLOBIN A1C: Hgb A1c MFr Bld: 6.5 % (ref 4.6–6.5)

## 2021-11-22 ENCOUNTER — Telehealth: Payer: Self-pay

## 2021-11-22 LAB — HEPATITIS C ANTIBODY: Hepatitis C Ab: NONREACTIVE

## 2021-11-22 NOTE — Telephone Encounter (Signed)
Lvm asking pt to call back.  Need to relay results, Dr. Synthia Innocent message and schedule 3 mo DM f/u visit.  Labs/Dr. Synthia Innocent msg: Your kidneys and electrolytes returned normal.  Your sugar was in controlled diabetes range at 6.5%.  Work on low sugar low carb diabetic diet and schedule an office visit in 3 months to recheck levels and further review diabetes options.  One liver function ws mildly elevated - this may be due to fatty liver.  Your cholesterol levels were elevated.  Increase fiber and legumes in the diet to help lower LDL cholesterol.

## 2021-11-23 NOTE — Telephone Encounter (Addendum)
Pt viewed results in [place] on 11/22/21 at 8:05 PM.  Lvm asking pt to call back.  Needs to schedule 3 mo DM f/u visit.

## 2021-11-24 NOTE — Telephone Encounter (Signed)
Left message on vm per dpr asking pt to call back to schedule 3 mo DM f/u visit.

## 2022-01-16 ENCOUNTER — Other Ambulatory Visit: Payer: Self-pay | Admitting: Family Medicine

## 2022-03-09 ENCOUNTER — Encounter: Payer: Self-pay | Admitting: Family Medicine

## 2022-03-09 ENCOUNTER — Ambulatory Visit (INDEPENDENT_AMBULATORY_CARE_PROVIDER_SITE_OTHER): Payer: No Typology Code available for payment source | Admitting: Family Medicine

## 2022-03-09 VITALS — BP 136/78 | HR 67 | Temp 97.2°F | Ht 68.25 in | Wt 197.0 lb

## 2022-03-09 DIAGNOSIS — E1169 Type 2 diabetes mellitus with other specified complication: Secondary | ICD-10-CM

## 2022-03-09 DIAGNOSIS — E7800 Pure hypercholesterolemia, unspecified: Secondary | ICD-10-CM

## 2022-03-09 DIAGNOSIS — Z1211 Encounter for screening for malignant neoplasm of colon: Secondary | ICD-10-CM

## 2022-03-09 DIAGNOSIS — R739 Hyperglycemia, unspecified: Secondary | ICD-10-CM

## 2022-03-09 DIAGNOSIS — R7401 Elevation of levels of liver transaminase levels: Secondary | ICD-10-CM | POA: Diagnosis not present

## 2022-03-09 LAB — POCT GLYCOSYLATED HEMOGLOBIN (HGB A1C): Hemoglobin A1C: 6.3 % — AB (ref 4.0–5.6)

## 2022-03-09 MED ORDER — METFORMIN HCL 500 MG PO TABS: 500.0000 mg | ORAL_TABLET | Freq: Every day | ORAL | 3 refills | Status: DC

## 2022-03-09 NOTE — Progress Notes (Signed)
Patient ID: [name], male    DOB: April 12, 1975, 47 y.o.   MRN: [id number]  This visit was conducted in person.  BP 136/78   Pulse 67   Temp (!) 97.2 F (36.2 C) (Temporal)   Ht 5' 8.25" (1.734 m)   Wt 197 lb (89.4 kg)   SpO2 98%   BMI 29.73 kg/m    CC: DM f/u visit  Subjective:   HPI: Grant [name] is a 47 y.o. male presenting on 03/09/2022 for Medical Management of Chronic Issues (Here for 3 mo DM f/u.)   He's changed diet - decreased portion sizes with carbs specifically rice.   DM - does not regularly check sugars. Compliant with antihyperglycemic regimen which includes: diet controlled. Denies low sugars or hypoglycemic symptoms. Denies paresthesias, blurry vision. Last diabetic eye exam: 2023 - tries to see yearly. Glucometer brand: doesn't have this at home. Last foot exam: Due. DSME: not completed.  Lab Results  Component Value Date   HGBA1C 6.3 (A) 03/09/2022   Diabetic Foot Exam - Simple   Simple Foot Form Diabetic Foot exam was performed with the following findings: Yes 03/09/2022  8:38 AM  Visual Inspection No deformities, no ulcerations, no other skin breakdown bilaterally: Yes Sensation Testing Intact to touch and monofilament testing bilaterally: Yes Pulse Check Posterior Tibialis and Dorsalis pulse intact bilaterally: Yes Comments    No results found for: "MICROALBUR", "[id number]"    No fmhx CAD/MI or stroke.  He does have fmhx HLD - mother and father.    Requests new referral to [facility].     Relevant past medical, surgical, family and social history reviewed and updated as indicated. Interim medical history since our last visit reviewed. Allergies and medications reviewed and updated. Outpatient Medications Prior to Visit  Medication Sig Dispense Refill   atomoxetine (STRATTERA) 40 MG capsule TAKE 1 CAPSULE (40 MG TOTAL) BY MOUTH DAILY. 90 capsule 2   cetirizine (ZYRTEC) 10 MG tablet Take 1 tablet (10 mg total) by mouth daily. (Patient  taking differently: Take 10 mg by mouth daily as needed.) 30 tablet 11   GINSENG PO Take 1 capsule by mouth daily. Takes 500 mg capsule     Green Tea 250 MG CAPS Take by mouth daily.     loratadine (CLARITIN) 10 MG tablet Take 10 mg by mouth daily as needed.      MISC NATURAL PRODUCTS PO Take by mouth daily. [place]     Multiple Vitamins-Minerals (CENTRUM ADULT PO) Take by mouth daily.     Probiotic Product (PROBIOTIC PO) Take by mouth daily.     No facility-administered medications prior to visit.     Per HPI unless specifically indicated in ROS section below Review of Systems  Objective:  BP 136/78   Pulse 67   Temp (!) 97.2 F (36.2 C) (Temporal)   Ht 5' 8.25" (1.734 m)   Wt 197 lb (89.4 kg)   SpO2 98%   BMI 29.73 kg/m   Wt Readings from Last 3 Encounters:  03/09/22 197 lb (89.4 kg)  11/17/21 197 lb 2 oz (89.4 kg)  01/04/20 181 lb 12.8 oz (82.5 kg)      Physical Exam Vitals and nursing note reviewed.  Constitutional:      Appearance: Normal appearance. He is not ill-appearing.  HENT:     Head: Normocephalic and atraumatic.  Eyes:     Extraocular Movements: Extraocular movements intact.     Conjunctiva/sclera: Conjunctivae normal.     Pupils:  Pupils are equal, round, and reactive to light.  Cardiovascular:     Rate and Rhythm: Normal rate and regular rhythm.     Pulses: Normal pulses.     Heart sounds: Normal heart sounds. No murmur heard. Pulmonary:     Effort: Pulmonary effort is normal. No respiratory distress.     Breath sounds: Normal breath sounds. No wheezing, rhonchi or rales.  Musculoskeletal:     Right lower leg: No edema.     Left lower leg: No edema.     Comments: See HPI for foot exam if done  Skin:    General: Skin is warm and dry.     Findings: No rash.  Neurological:     Mental Status: He is alert.  Psychiatric:        Mood and Affect: Mood normal.        Behavior: Behavior normal.       Results for orders placed or performed in visit  on 03/09/22  POCT glycosylated hemoglobin (Hb A1C)  Result Value Ref Range   Hemoglobin A1C 6.3 (A) 4.0 - 5.6 %   HbA1c POC (<> result, manual entry)     HbA1c, POC (prediabetic range)     HbA1c, POC (controlled diabetic range)      Assessment & Plan:   Problem List Items Addressed This Visit     HYPERCHOLESTEROLEMIA    Discussed statin indication in diabetes. As levels now in prediabetic range, will work on TLC for 4-6 months and reassess control.  The 10-year ASCVD risk score (Arnett DK, et al., 2019) is: 7.3%   Values used to calculate the score:     Age: 75 years     Sex: Male     Is Non-Hispanic African American: No     Diabetic: Yes     Tobacco smoker: No     Systolic Blood Pressure: 136 mmHg     Is BP treated: No     HDL Cholesterol: 43.3 mg/dL     Total Cholesterol: 234 mg/dL       Relevant Orders   Lipid panel   Comprehensive metabolic panel   Type 2 diabetes mellitus with other specified complication (HCC) - Primary    Recent diagnosis, now in prediabetes range with healthy diet choices. Reviewed pathophysiology of type 2 diabetes as well as management strategies.  He will continue to work on healthy diet and lifestyle choices for optimal glycemic control.  Foot exam today. Rec diabetic eye exam at next eye doctor appt.  Start low dose metformin 500mg  daily.  RTC 4-5 months DM f/u visit.       Relevant Medications   metFORMIN (GLUCOPHAGE) 500 MG tablet   Other Relevant Orders   POCT glycosylated hemoglobin (Hb A1C) (Completed)   Hemoglobin A1c   Transaminitis    Anticipate related to fatty liver.  Recheck next labs       Other Visit Diagnoses     Special screening for malignant neoplasms, colon       Relevant Orders   Ambulatory referral to Gastroenterology        Meds ordered this encounter  Medications   metFORMIN (GLUCOPHAGE) 500 MG tablet    Sig: Take 1 tablet (500 mg total) by mouth daily with breakfast.    Dispense:  90 tablet     Refill:  3    Orders Placed This Encounter  Procedures   Lipid panel    Standing Status:   Future  Standing Expiration Date:   03/10/2023   Comprehensive metabolic panel    Standing Status:   Future    Standing Expiration Date:   03/10/2023   Hemoglobin A1c    Standing Status:   Future    Standing Expiration Date:   03/10/2023   Ambulatory referral to Gastroenterology    Referral Priority:   Routine    Referral Type:   Consultation    Referral Reason:   Specialty Services Required    Number of Visits Requested:   1   POCT glycosylated hemoglobin (Hb A1C)    Patient Instructions  You may call [facility] to schedule an appointment for colonoscopy at [phone].  Start metformin - 500mg  dose daily sent to pharmacy.  Return in 4-6 months for follow up diabetes and fasting labs.   Follow up plan: Return in about 5 months (around 08/08/2022), or if symptoms worsen or fail to improve.  Ria Bush, MD

## 2022-03-09 NOTE — Patient Instructions (Addendum)
You may call [facility] to schedule an appointment for colonoscopy at [phone].  Start metformin - 500mg  dose daily sent to pharmacy.  Return in 4-6 months for follow up diabetes and fasting labs.

## 2022-03-09 NOTE — Assessment & Plan Note (Signed)
Anticipate related to fatty liver.  Recheck next labs

## 2022-03-09 NOTE — Assessment & Plan Note (Signed)
Recent diagnosis, now in prediabetes range with healthy diet choices. Reviewed pathophysiology of type 2 diabetes as well as management strategies.  He will continue to work on healthy diet and lifestyle choices for optimal glycemic control.  Foot exam today. Rec diabetic eye exam at next eye doctor appt.  Start low dose metformin 500mg  daily.  RTC 4-5 months DM f/u visit.

## 2022-03-09 NOTE — Assessment & Plan Note (Signed)
Discussed statin indication in diabetes. As levels now in prediabetic range, will work on TLC for 4-6 months and reassess control.  The 10-year ASCVD risk score (Arnett DK, et al., 2019) is: 7.3%   Values used to calculate the score:     Age: 47 years     Sex: Male     Is Non-Hispanic African American: No     Diabetic: Yes     Tobacco smoker: No     Systolic Blood Pressure: 025 mmHg     Is BP treated: No     HDL Cholesterol: 43.3 mg/dL     Total Cholesterol: 234 mg/dL

## 2022-08-02 ENCOUNTER — Other Ambulatory Visit: Payer: Self-pay | Admitting: Family Medicine

## 2022-08-03 ENCOUNTER — Other Ambulatory Visit (INDEPENDENT_AMBULATORY_CARE_PROVIDER_SITE_OTHER): Payer: 59

## 2022-08-03 DIAGNOSIS — E1169 Type 2 diabetes mellitus with other specified complication: Secondary | ICD-10-CM | POA: Diagnosis not present

## 2022-08-03 DIAGNOSIS — Z7984 Long term (current) use of oral hypoglycemic drugs: Secondary | ICD-10-CM | POA: Diagnosis not present

## 2022-08-03 DIAGNOSIS — E7800 Pure hypercholesterolemia, unspecified: Secondary | ICD-10-CM | POA: Diagnosis not present

## 2022-08-03 LAB — COMPREHENSIVE METABOLIC PANEL
ALT: 48 U/L (ref 0–53)
AST: 22 U/L (ref 0–37)
Albumin: 4.7 g/dL (ref 3.5–5.2)
Alkaline Phosphatase: 66 U/L (ref 39–117)
BUN: 13 mg/dL (ref 6–23)
CO2: 29 mEq/L (ref 19–32)
Calcium: 9.6 mg/dL (ref 8.4–10.5)
Chloride: 101 mEq/L (ref 96–112)
Creatinine, Ser: 0.96 mg/dL (ref 0.40–1.50)
GFR: 94.76 mL/min (ref >60.00)
Glucose, Bld: 112 mg/dL — ABNORMAL HIGH (ref 70–99)
Potassium: 3.9 mEq/L (ref 3.5–5.1)
Sodium: 139 mEq/L (ref 135–145)
Total Bilirubin: 0.8 mg/dL (ref 0.2–1.2)
Total Protein: 7.3 g/dL (ref 6.0–8.3)

## 2022-08-03 LAB — LIPID PANEL
Cholesterol: 218 mg/dL — ABNORMAL HIGH (ref 0–200)
HDL: 47.9 mg/dL (ref >39.00)
LDL Cholesterol: 149 mg/dL — ABNORMAL HIGH (ref 0–99)
NonHDL: 170.01
Total CHOL/HDL Ratio: 5
Triglycerides: 105 mg/dL (ref 0.0–149.0)
VLDL: 21 mg/dL (ref 0.0–40.0)

## 2022-08-03 LAB — HEMOGLOBIN A1C: Hgb A1c MFr Bld: 6.1 % (ref 4.6–6.5)

## 2022-08-10 ENCOUNTER — Encounter: Payer: Self-pay | Admitting: *Deleted

## 2022-08-10 ENCOUNTER — Encounter: Payer: Self-pay | Admitting: Family Medicine

## 2022-08-10 ENCOUNTER — Ambulatory Visit (INDEPENDENT_AMBULATORY_CARE_PROVIDER_SITE_OTHER): Payer: 59 | Admitting: Family Medicine

## 2022-08-10 VITALS — BP 134/82 | HR 115 | Temp 97.4°F | Ht 68.25 in | Wt 187.0 lb

## 2022-08-10 DIAGNOSIS — E663 Overweight: Secondary | ICD-10-CM | POA: Insufficient documentation

## 2022-08-10 DIAGNOSIS — R000 Tachycardia, unspecified: Secondary | ICD-10-CM | POA: Insufficient documentation

## 2022-08-10 DIAGNOSIS — E1169 Type 2 diabetes mellitus with other specified complication: Secondary | ICD-10-CM | POA: Diagnosis not present

## 2022-08-10 DIAGNOSIS — R7401 Elevation of levels of liver transaminase levels: Secondary | ICD-10-CM

## 2022-08-10 DIAGNOSIS — R41840 Attention and concentration deficit: Secondary | ICD-10-CM | POA: Diagnosis not present

## 2022-08-10 DIAGNOSIS — Z7984 Long term (current) use of oral hypoglycemic drugs: Secondary | ICD-10-CM

## 2022-08-10 DIAGNOSIS — G4700 Insomnia, unspecified: Secondary | ICD-10-CM | POA: Insufficient documentation

## 2022-08-10 NOTE — Assessment & Plan Note (Signed)
Chronic, well controlled on current regimen of metformin 500mg  daily. He's also implemented healthy diet changes.  Congratulated on 10 lb weight loss since last visit.  Declines glucometer script, DSME referral.

## 2022-08-10 NOTE — Patient Instructions (Addendum)
Congrats on weight loss and improving sugar control! Get diabetic eye exam next visit Heart rate was a bit on the high side - monitor pulse a few times a week going forward, let me know if consistently >100.  Return in 4 months for physical.  Bedtime routine checklist: 1. Avoid naps during the day 2. Avoid stimulants such as caffeine and nicotine. Avoid bedtime alcohol (it can speed onset of sleep but the body's metabolism can cause awakenings). 3. All forms of exercise help ensure sound sleep - limit vigorous exercise to morning or late afternoon 4. Avoid food too close to bedtime including chocolate (which contains caffeine) 5. Soak up natural light 6. Establish regular bedtime routine. 7. Associate bed with sleep - avoid TV, computer or phone, reading while in bed. 8. Ensure pleasant, relaxing sleep environment - quiet, dark, cool room.

## 2022-08-10 NOTE — Progress Notes (Signed)
Ph: [phone] Fax: [phone]   Patient ID: Grant [name], male    DOB: [birth year], 47 y.o.   MRN: [id number]  This visit was conducted in person.  BP 134/82   Pulse (!) 115   Temp (!) 97.4 F (36.3 C) (Temporal)   Ht 5' 8.25" (1.734 m)   Wt 187 lb (84.8 kg)   SpO2 96%   BMI 28.23 kg/m   BP Readings from Last 3 Encounters:  08/10/22 134/82  03/09/22 136/78  11/17/21 134/84   Pulse Readings from Last 3 Encounters:  08/10/22 (!) 115  03/09/22 67  11/17/21 100    CC: DM f/u visit  Subjective:   HPI: Grant [name] is a 47 y.o. male presenting on 08/10/2022 for Medical Management of Chronic Issues (Here for 5 mo f/u.)   Elevated heart rate in setting of recent caffeine intake (12 oz).   ADD - continues strattera with benefit. Pulse staying elevated.   Insomnia - both initiation and maintenance. Tried benadryl, melatonin 10mg  PRN with limited benefit.   DM --> prediabetes - does not regularly check sugars. Compliant with antihyperglycemic regimen which includes: metformin 500mg  daily. Watching portion size of rice, has dropped 10 lbs. Denies low sugars or hypoglycemic symptoms. Denies paresthesias, blurry vision. Last diabetic eye exam 2023. Glucometer brand: doesn't have at home. Last foot exam: 02/2022. DSME: not completed.  Lab Results  Component Value Date   HGBA1C 6.1 08/03/2022   Diabetic Foot Exam - Simple   No data filed    No results found for: "MICROALBUR", "[id number]"      Relevant past medical, surgical, family and social history reviewed and updated as indicated. Interim medical history since our last visit reviewed. Allergies and medications reviewed and updated. Outpatient Medications Prior to Visit  Medication Sig Dispense Refill   atomoxetine (STRATTERA) 40 MG capsule TAKE 1 CAPSULE (40 MG TOTAL) BY MOUTH DAILY. 90 capsule 2   cetirizine (ZYRTEC) 10 MG tablet Take 1 tablet (10 mg total) by mouth daily. (Patient taking differently: Take 10  mg by mouth daily as needed.) 30 tablet 11   GINSENG PO Take 1 capsule by mouth daily. Takes 500 mg capsule     Green Tea 250 MG CAPS Take by mouth daily.     loratadine (CLARITIN) 10 MG tablet Take 10 mg by mouth daily as needed.      Melatonin 10 MG TABS Take 1 tablet by mouth daily as needed.     metFORMIN (GLUCOPHAGE) 500 MG tablet Take 1 tablet (500 mg total) by mouth daily with breakfast. 90 tablet 3   MISC NATURAL PRODUCTS PO Take by mouth daily. Caralluma     Multiple Vitamins-Minerals (CENTRUM ADULT PO) Take by mouth daily.     Probiotic Product (PROBIOTIC PO) Take by mouth daily.     No facility-administered medications prior to visit.     Per HPI unless specifically indicated in ROS section below Review of Systems  Objective:  BP 134/82   Pulse (!) 115   Temp (!) 97.4 F (36.3 C) (Temporal)   Ht 5' 8.25" (1.734 m)   Wt 187 lb (84.8 kg)   SpO2 96%   BMI 28.23 kg/m   Wt Readings from Last 3 Encounters:  08/10/22 187 lb (84.8 kg)  03/09/22 197 lb (89.4 kg)  11/17/21 197 lb 2 oz (89.4 kg)      Physical Exam Vitals and nursing note reviewed.  Constitutional:      Appearance: Normal appearance. He  is not ill-appearing.  Eyes:     Extraocular Movements: Extraocular movements intact.     Conjunctiva/sclera: Conjunctivae normal.     Pupils: Pupils are equal, round, and reactive to light.  Cardiovascular:     Rate and Rhythm: Normal rate and regular rhythm.     Pulses: Normal pulses.     Heart sounds: Normal heart sounds. No murmur heard. Pulmonary:     Effort: Pulmonary effort is normal. No respiratory distress.     Breath sounds: Normal breath sounds. No wheezing, rhonchi or rales.  Musculoskeletal:     Right lower leg: No edema.     Left lower leg: No edema.     Comments: See HPI for foot exam if done  Skin:    General: Skin is warm and dry.     Findings: No rash.  Neurological:     Mental Status: He is alert.  Psychiatric:        Mood and Affect: Mood  normal.        Behavior: Behavior normal.       Results for orders placed or performed in visit on 08/03/22  Hemoglobin A1c  Result Value Ref Range   Hgb A1c MFr Bld 6.1 4.6 - 6.5 %  Comprehensive metabolic panel  Result Value Ref Range   Sodium 139 135 - 145 mEq/L   Potassium 3.9 3.5 - 5.1 mEq/L   Chloride 101 96 - 112 mEq/L   CO2 29 19 - 32 mEq/L   Glucose, Bld 112 (H) 70 - 99 mg/dL   BUN 13 6 - 23 mg/dL   Creatinine, Ser 1.61 0.40 - 1.50 mg/dL   Total Bilirubin 0.8 0.2 - 1.2 mg/dL   Alkaline Phosphatase 66 39 - 117 U/L   AST 22 0 - 37 U/L   ALT 48 0 - 53 U/L   Total Protein 7.3 6.0 - 8.3 g/dL   Albumin 4.7 3.5 - 5.2 g/dL   GFR 09.60 >45.40 mL/min   Calcium 9.6 8.4 - 10.5 mg/dL  Lipid panel  Result Value Ref Range   Cholesterol 218 (H) 0 - 200 mg/dL   Triglycerides 981.1 0.0 - 149.0 mg/dL   HDL 91.47 >82.95 mg/dL   VLDL 62.1 0.0 - 30.8 mg/dL   LDL Cholesterol 657 (H) 0 - 99 mg/dL   Total CHOL/HDL Ratio 5    NonHDL 170.01     Assessment & Plan:   Problem List Items Addressed This Visit     Inattention    Stable period on strattera 40mg  daily.       Type 2 diabetes mellitus with other specified complication (HCC) - Primary    Chronic, well controlled on current regimen of metformin 500mg  daily. He's also implemented healthy diet changes.  Congratulated on 10 lb weight loss since last visit.  Declines glucometer script, DSME referral.       Transaminitis    This has resolved with weight loss and better sugar control      Overweight with body mass index (BMI) 25.0-29.9    Congratulated on 10 lb weight loss over the past 5 months - motivated to continue healthy diet choices.       Insomnia    Mixed insomnia - both initiation and maintenance issues.  Treating with melatonin and benadryl PRN, only occasionally effective. Reviewed sleep hygiene measures, handout provided. Discussed option of trial trazodone if above not helpful.       Tachycardia    Noted  in office today,  in setting of possibly rushing to get to appointment and caffeine intake.  I've asked him to monitor pulse at home and let me know if consistently >100.  ?strattera effect        No orders of the defined types were placed in this encounter.   No orders of the defined types were placed in this encounter.   Patient Instructions  Congrats on weight loss and improving sugar control! Get diabetic eye exam next visit Heart rate was a bit on the high side - monitor pulse a few times a week going forward, let me know if consistently >100.  Return in 4 months for physical.  Bedtime routine checklist: 1. Avoid naps during the day 2. Avoid stimulants such as caffeine and nicotine. Avoid bedtime alcohol (it can speed onset of sleep but the body's metabolism can cause awakenings). 3. All forms of exercise help ensure sound sleep - limit vigorous exercise to morning or late afternoon 4. Avoid food too close to bedtime including chocolate (which contains caffeine) 5. Soak up natural light 6. Establish regular bedtime routine. 7. Associate bed with sleep - avoid TV, computer or phone, reading while in bed. 8. Ensure pleasant, relaxing sleep environment - quiet, dark, cool room.   Follow up plan: Return in about 4 months (around 12/10/2022), or if symptoms worsen or fail to improve, for annual exam, prior fasting for blood work.  Eustaquio Boyden, MD

## 2022-08-10 NOTE — Assessment & Plan Note (Signed)
Stable period on strattera 40mg  daily.

## 2022-08-10 NOTE — Assessment & Plan Note (Signed)
Noted in office today, in setting of possibly rushing to get to appointment and caffeine intake.  I've asked him to monitor pulse at home and let me know if consistently >100.  ?strattera effect

## 2022-08-10 NOTE — Assessment & Plan Note (Signed)
Congratulated on 10 lb weight loss over the past 5 months - motivated to continue healthy diet choices.

## 2022-08-10 NOTE — Assessment & Plan Note (Signed)
Mixed insomnia - both initiation and maintenance issues.  Treating with melatonin and benadryl PRN, only occasionally effective. Reviewed sleep hygiene measures, handout provided. Discussed option of trial trazodone if above not helpful.

## 2022-08-10 NOTE — Assessment & Plan Note (Signed)
This has resolved with weight loss and better sugar control

## 2022-08-13 LAB — HM DIABETES EYE EXAM

## 2022-10-05 ENCOUNTER — Encounter: Payer: Self-pay | Admitting: Gastroenterology

## 2022-11-13 ENCOUNTER — Other Ambulatory Visit: Payer: No Typology Code available for payment source

## 2022-11-20 ENCOUNTER — Encounter: Payer: No Typology Code available for payment source | Admitting: Family Medicine

## 2022-11-27 HISTORY — PX: COLONOSCOPY: SHX174

## 2022-12-05 ENCOUNTER — Encounter: Payer: Self-pay | Admitting: Gastroenterology

## 2022-12-05 ENCOUNTER — Ambulatory Visit (AMBULATORY_SURGERY_CENTER): Payer: 59

## 2022-12-05 VITALS — Ht 68.13 in | Wt 185.0 lb

## 2022-12-05 DIAGNOSIS — Z1211 Encounter for screening for malignant neoplasm of colon: Secondary | ICD-10-CM

## 2022-12-05 MED ORDER — NA SULFATE-K SULFATE-MG SULF 17.5-3.13-1.6 GM/177ML PO SOLN: 1.0000 | Freq: Once | ORAL | 0 refills | Status: AC

## 2022-12-05 NOTE — Progress Notes (Signed)

## 2022-12-07 ENCOUNTER — Other Ambulatory Visit: Payer: 59

## 2022-12-14 ENCOUNTER — Encounter: Payer: 59 | Admitting: Family Medicine

## 2022-12-14 ENCOUNTER — Encounter: Payer: Self-pay | Admitting: Gastroenterology

## 2022-12-14 ENCOUNTER — Ambulatory Visit (AMBULATORY_SURGERY_CENTER): Payer: 59 | Admitting: Gastroenterology

## 2022-12-14 VITALS — BP 113/79 | HR 73 | Temp 98.4°F | Resp 14 | Ht 68.0 in | Wt 185.0 lb

## 2022-12-14 DIAGNOSIS — Z1211 Encounter for screening for malignant neoplasm of colon: Secondary | ICD-10-CM | POA: Diagnosis present

## 2022-12-14 NOTE — Op Note (Signed)
[facility] Patient Name: Grant [name] Procedure Date: 12/14/2022 9:58 AM MRN: [id number] Endoscopist: Viviann Spare P. Adela Lank , MD, [id number] Age: 47 Referring MD:  Date of Birth: 1975/05/12 Gender: Male Account #: [id number] Procedure:                Colonoscopy Indications:              Screening for colorectal malignant neoplasm, This                            is the patient's first colonoscopy Medicines:                Monitored Anesthesia Care Procedure:                Pre-Anesthesia Assessment:                           - Prior to the procedure, a History and Physical                            was performed, and patient medications and                            allergies were reviewed. The patient's tolerance of                            previous anesthesia was also reviewed. The risks                            and benefits of the procedure and the sedation                            options and risks were discussed with the patient.                            All questions were answered, and informed consent                            was obtained. Prior Anticoagulants: The patient has                            taken no anticoagulant or antiplatelet agents. ASA                            Grade Assessment: II - A patient with mild systemic                            disease. After reviewing the risks and benefits,                            the patient was deemed in satisfactory condition to                            undergo the procedure.  After obtaining informed consent, the colonoscope                            was passed under direct vision. Throughout the                            procedure, the patient's blood pressure, pulse, and                            oxygen saturations were monitored continuously. The                            Olympus Scope [id number] was introduced through the                            anus and  advanced to the the cecum, identified by                            appendiceal orifice and ileocecal valve. The                            colonoscopy was performed without difficulty. The                            patient tolerated the procedure well. The quality                            of the bowel preparation was good. The ileocecal                            valve, appendiceal orifice, and rectum were                            photographed. Scope In: 10:12:24 AM Scope Out: 10:27:50 AM Scope Withdrawal Time: 0 hours 13 minutes 1 second  Total Procedure Duration: 0 hours 15 minutes 26 seconds  Findings:                 The perianal and digital rectal examinations were                            normal.                           The exam was otherwise without abnormality on                            direct and retroflexion views. NO polyps. Complications:            No immediate complications. Estimated blood loss:                            None. Estimated Blood Loss:     Estimated blood loss: none. Impression:               - The examination was otherwise normal on direct  and retroflexion views.                           - No specimens collected. Recommendation:           - Patient has a contact number available for                            emergencies. The signs and symptoms of potential                            delayed complications were discussed with the                            patient. Return to normal activities tomorrow.                            Written discharge instructions were provided to the                            patient.                           - Resume previous diet.                           - Continue present medications.                           - Repeat colonoscopy in 10 years for screening                            purposes. Viviann Spare P. Kellene Mccleary, MD 12/14/2022 10:30:48 AM This report has been signed  electronically.

## 2022-12-14 NOTE — Patient Instructions (Signed)
Discharge instructions given. Normal exam. Resume previous medications. YOU HAD AN ENDOSCOPIC PROCEDURE TODAY AT THE [facility]:   Refer to the procedure report that was given to you for any specific questions about what was found during the examination.  If the procedure report does not answer your questions, please call your gastroenterologist to clarify.  If you requested that your care partner not be given the details of your procedure findings, then the procedure report has been included in a sealed envelope for you to review at your convenience later.  YOU SHOULD EXPECT: Some feelings of bloating in the abdomen. Passage of more gas than usual.  Walking can help get rid of the air that was put into your GI tract during the procedure and reduce the bloating. If you had a lower endoscopy (such as a colonoscopy or flexible sigmoidoscopy) you may notice spotting of blood in your stool or on the toilet paper. If you underwent a bowel prep for your procedure, you may not have a normal bowel movement for a few days.  Please Note:  You might notice some irritation and congestion in your nose or some drainage.  This is from the oxygen used during your procedure.  There is no need for concern and it should clear up in a day or so.  SYMPTOMS TO REPORT IMMEDIATELY:  Following lower endoscopy (colonoscopy or flexible sigmoidoscopy):  Excessive amounts of blood in the stool  Significant tenderness or worsening of abdominal pains  Swelling of the abdomen that is new, acute  Fever of 100F or higher   For urgent or emergent issues, a gastroenterologist can be reached at any hour by calling [phone]. Do not use MyChart messaging for urgent concerns.    DIET:  We do recommend a small meal at first, but then you may proceed to your regular diet.  Drink plenty of fluids but you should avoid alcoholic beverages for 24 hours.  ACTIVITY:  You should plan to take it easy for the rest of  today and you should NOT DRIVE or use heavy machinery until tomorrow (because of the sedation medicines used during the test).    FOLLOW UP: Our staff will call the number listed on your records the next business day following your procedure.  We will call around 7:15- 8:00 am to check on you and address any questions or concerns that you may have regarding the information given to you following your procedure. If we do not reach you, we will leave a message.     If any biopsies were taken you will be contacted by phone or by letter within the next 1-3 weeks.  Please call us at [phone] if you have not heard about the biopsies in 3 weeks.    SIGNATURES/CONFIDENTIALITY: You and/or your care partner have signed paperwork which will be entered into your electronic medical record.  These signatures attest to the fact that that the information above on your After Visit Summary has been reviewed and is understood.  Full responsibility of the confidentiality of this discharge information lies with you and/or your care-partner. 

## 2022-12-14 NOTE — Progress Notes (Signed)
Report given to PACU, vss 

## 2022-12-14 NOTE — Progress Notes (Signed)
Pt's states no medical or surgical changes since previsit or office visit. 

## 2022-12-14 NOTE — Progress Notes (Signed)
[facility] History and Physical   Primary Care Physician:  Eustaquio Boyden, MD   Reason for Procedure:   Colon cancer screening  Plan:    colonoscopy     HPI: Grant [name] is a 47 y.o. male  here for colonoscopy screening - first time exam.   Patient denies any bowel symptoms at this time. No family history of colon cancer known. Otherwise feels well without any cardiopulmonary symptoms.   I have discussed risks / benefits of anesthesia and endoscopic procedure with [facility] and they wish to proceed with the exams as outlined today.    Past Medical History:  Diagnosis Date   Allergy    GERD (gastroesophageal reflux disease)    Hyperlipemia    Post concussive syndrome 03/20/2011    History reviewed. No pertinent surgical history.  Prior to Admission medications   Medication Sig Start Date End Date Taking? Authorizing Provider  atomoxetine (STRATTERA) 40 MG capsule TAKE 1 CAPSULE (40 MG TOTAL) BY MOUTH DAILY. 01/16/22  Yes Eustaquio Boyden, MD  cetirizine (ZYRTEC) 10 MG tablet Take 1 tablet (10 mg total) by mouth daily. Patient taking differently: Take 10 mg by mouth daily as needed. 03/30/13  Yes Bedsole, Amy E, MD  GINSENG PO Take 1 capsule by mouth daily. Takes 500 mg capsule   Yes [provider]  Green Tea 250 MG CAPS Take by mouth daily.   Yes [provider]  metFORMIN (GLUCOPHAGE) 500 MG tablet Take 1 tablet (500 mg total) by mouth daily with breakfast. 03/09/22  Yes Eustaquio Boyden, MD  Probiotic Product (PROBIOTIC PO) Take by mouth daily.   Yes [provider]  loratadine (CLARITIN) 10 MG tablet Take 10 mg by mouth daily as needed.     [provider]  Melatonin 10 MG TABS Take 1 tablet by mouth daily as needed.    [provider]  MISC NATURAL PRODUCTS PO Take by mouth daily. Caralluma    [provider]  Multiple Vitamins-Minerals (CENTRUM ADULT PO) Take by mouth daily.    [provider]    Current Outpatient Medications  Medication Sig Dispense Refill   atomoxetine (STRATTERA) 40 MG capsule TAKE 1 CAPSULE (40 MG TOTAL) BY MOUTH DAILY. 90 capsule 2   cetirizine (ZYRTEC) 10 MG tablet Take 1 tablet (10 mg total) by mouth daily. (Patient taking differently: Take 10 mg by mouth daily as needed.) 30 tablet 11   GINSENG PO Take 1 capsule by mouth daily. Takes 500 mg capsule     Green Tea 250 MG CAPS Take by mouth daily.     metFORMIN (GLUCOPHAGE) 500 MG tablet Take 1 tablet (500 mg total) by mouth daily with breakfast. 90 tablet 3   Probiotic Product (PROBIOTIC PO) Take by mouth daily.     loratadine (CLARITIN) 10 MG tablet Take 10 mg by mouth daily as needed.      Melatonin 10 MG TABS Take 1 tablet by mouth daily as needed.     MISC NATURAL PRODUCTS PO Take by mouth daily. Caralluma     Multiple Vitamins-Minerals (CENTRUM ADULT PO) Take by mouth daily.     No current facility-administered medications for this visit.    Allergies as of 12/14/2022   (No Known Allergies)    Family History  Problem Relation Age of Onset   Hyperlipidemia Mother    Hyperlipidemia Father    Cancer Maternal Uncle        prostate   Cancer Maternal Grandmother  lung (smoker)   Stroke Neg Hx    Colon cancer Neg Hx    Colon polyps Neg Hx    Esophageal cancer Neg Hx    Rectal cancer Neg Hx    Stomach cancer Neg Hx     Social History   Socioeconomic History   Marital status: Married    Spouse name: Not on file   Number of children: Not on file   Years of education: Not on file   Highest education level: Not on file  Occupational History   Occupation: student  Tobacco Use   Smoking status: Never   Smokeless tobacco: Never  Vaping Use   Vaping status: Never Used  Substance and Sexual Activity   Alcohol use: Yes    Comment: rare   Drug use: No   Sexual activity: Not on file  Other Topics Concern   Not on file  Social History Narrative   Lives with wife  Scientist, 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  Physical Exam: Vital signs BP (!) 143/88   Pulse 88   Temp 98.4 F (36.9 C) (Oral)   Ht 5\' 8"  (1.727 m)   Wt 185 lb (83.9 kg)   SpO2 100%   BMI 28.13 kg/m   General:   Alert,  Well-developed, pleasant and cooperative in NAD Lungs:  Clear throughout to auscultation.   Heart:  Regular rate and rhythm Abdomen:  Soft, nontender and nondistended.   Neuro/Psych:  Alert and cooperative. Normal mood and affect. A and O x 3  Harlin Rain, MD [facility]

## 2022-12-17 ENCOUNTER — Telehealth: Payer: Self-pay

## 2022-12-17 NOTE — Telephone Encounter (Signed)
Post procedure follow up call, no answer 

## 2023-01-25 ENCOUNTER — Other Ambulatory Visit: Payer: Self-pay | Admitting: Family Medicine

## 2023-01-28 NOTE — Telephone Encounter (Signed)
ERx 

## 2023-02-14 ENCOUNTER — Other Ambulatory Visit: Payer: Self-pay | Admitting: Family Medicine

## 2023-02-14 DIAGNOSIS — E7800 Pure hypercholesterolemia, unspecified: Secondary | ICD-10-CM

## 2023-02-14 DIAGNOSIS — E1169 Type 2 diabetes mellitus with other specified complication: Secondary | ICD-10-CM

## 2023-02-18 ENCOUNTER — Other Ambulatory Visit (INDEPENDENT_AMBULATORY_CARE_PROVIDER_SITE_OTHER): Payer: 59

## 2023-02-18 DIAGNOSIS — E7800 Pure hypercholesterolemia, unspecified: Secondary | ICD-10-CM

## 2023-02-18 DIAGNOSIS — E1169 Type 2 diabetes mellitus with other specified complication: Secondary | ICD-10-CM | POA: Diagnosis not present

## 2023-02-18 LAB — LIPID PANEL
Cholesterol: 217 mg/dL — ABNORMAL HIGH (ref 0–200)
HDL: 46.1 mg/dL (ref >39.00)
LDL Cholesterol: 154 mg/dL — ABNORMAL HIGH (ref 0–99)
NonHDL: 171.19
Total CHOL/HDL Ratio: 5
Triglycerides: 85 mg/dL (ref 0.0–149.0)
VLDL: 17 mg/dL (ref 0.0–40.0)

## 2023-02-18 LAB — COMPREHENSIVE METABOLIC PANEL
ALT: 30 U/L (ref 0–53)
AST: 20 U/L (ref 0–37)
Albumin: 4.5 g/dL (ref 3.5–5.2)
Alkaline Phosphatase: 68 U/L (ref 39–117)
BUN: 13 mg/dL (ref 6–23)
CO2: 30 meq/L (ref 19–32)
Calcium: 9.2 mg/dL (ref 8.4–10.5)
Chloride: 101 meq/L (ref 96–112)
Creatinine, Ser: 0.93 mg/dL (ref 0.40–1.50)
GFR: 98.07 mL/min (ref >60.00)
Glucose, Bld: 108 mg/dL — ABNORMAL HIGH (ref 70–99)
Potassium: 4.1 meq/L (ref 3.5–5.1)
Sodium: 138 meq/L (ref 135–145)
Total Bilirubin: 0.8 mg/dL (ref 0.2–1.2)
Total Protein: 7.1 g/dL (ref 6.0–8.3)

## 2023-02-18 LAB — VITAMIN B12: Vitamin B-12: 284 pg/mL (ref 211–911)

## 2023-02-18 LAB — HEMOGLOBIN A1C: Hgb A1c MFr Bld: 6.4 % (ref 4.6–6.5)

## 2023-02-18 NOTE — Addendum Note (Signed)
Addended by: Alvina Chou on: 02/18/2023 09:49 AM   Modules accepted: Orders

## 2023-03-05 ENCOUNTER — Ambulatory Visit (INDEPENDENT_AMBULATORY_CARE_PROVIDER_SITE_OTHER): Payer: 59 | Admitting: Family Medicine

## 2023-03-05 ENCOUNTER — Other Ambulatory Visit: Payer: Self-pay | Admitting: Family Medicine

## 2023-03-05 VITALS — BP 120/82 | HR 100 | Temp 97.9°F | Ht 68.0 in | Wt 182.2 lb

## 2023-03-05 DIAGNOSIS — E663 Overweight: Secondary | ICD-10-CM | POA: Diagnosis not present

## 2023-03-05 DIAGNOSIS — J309 Allergic rhinitis, unspecified: Secondary | ICD-10-CM

## 2023-03-05 DIAGNOSIS — Z0000 Encounter for general adult medical examination without abnormal findings: Secondary | ICD-10-CM

## 2023-03-05 DIAGNOSIS — E538 Deficiency of other specified B group vitamins: Secondary | ICD-10-CM | POA: Insufficient documentation

## 2023-03-05 DIAGNOSIS — R000 Tachycardia, unspecified: Secondary | ICD-10-CM

## 2023-03-05 DIAGNOSIS — E1169 Type 2 diabetes mellitus with other specified complication: Secondary | ICD-10-CM

## 2023-03-05 DIAGNOSIS — Z7984 Long term (current) use of oral hypoglycemic drugs: Secondary | ICD-10-CM

## 2023-03-05 DIAGNOSIS — R41840 Attention and concentration deficit: Secondary | ICD-10-CM

## 2023-03-05 DIAGNOSIS — G4700 Insomnia, unspecified: Secondary | ICD-10-CM

## 2023-03-05 DIAGNOSIS — E785 Hyperlipidemia, unspecified: Secondary | ICD-10-CM

## 2023-03-05 MED ORDER — METFORMIN HCL 500 MG PO TABS: 500.0000 mg | ORAL_TABLET | Freq: Every day | ORAL | 4 refills | Status: DC

## 2023-03-05 MED ORDER — CYANOCOBALAMIN 1000 MCG PO TABS: 1000.0000 ug | ORAL_TABLET | ORAL | Status: AC

## 2023-03-05 MED ORDER — ATOMOXETINE HCL 40 MG PO CAPS: 40.0000 mg | ORAL_CAPSULE | Freq: Every day | ORAL | 4 refills | Status: DC

## 2023-03-05 MED ORDER — ATORVASTATIN CALCIUM 20 MG PO TABS: 20.0000 mg | ORAL_TABLET | Freq: Every day | ORAL | 4 refills | Status: DC

## 2023-03-05 NOTE — Progress Notes (Addendum)
 Ph: [phone] Fax: [phone]   Patient ID: Grant [name], male    DOB: 10-11-1975, 48 y.o.   MRN: [id number]  This visit was conducted in person.  BP 120/82   Pulse 100   Temp 97.9 F (36.6 C) (Oral)   Ht 5' 8 (1.727 m)   Wt 182 lb 4 oz (82.7 kg)   SpO2 98%   BMI 27.71 kg/m    CC: CPE Subjective:   HPI: Grant [name] is a 48 y.o. male presenting on 03/05/2023 for Annual Exam   ADD - stable period on Strattera  40mg  daily. ?tachycardia side effect. Denies palpitations.  DM - stable period on metformin  500mg  daily.   Insomnia - both sleep initiation and sleep maintenance trouble, despite melatonin 10mg  PRN (rare use).   Preventative: Colonoscopy 11/2022 - WNL, rpt 10 yrs ([facility])  Prostate cancer screening - no fmhx. No weakening stream. Nocturia x1.  Flu shot yearly COVID vaccine - Pfizer 03/2019, 05/2019, booster x1  Tdap - 09/11/2016 Hep B series completed Seat belt use discussed Sunscreen use discussed. No changing moles on skin.  Sleep - averaging 5-6 hours/night  Non smoker  Alcohol - rare  Dentist - yearly  Eye exam - yearly    Lives with wife (also pharmacist) and daughter Occ: Pharmacist at [employer]  Activity: some exercising at home  Diet: lots of rice, fruits/vegetables, good water intake      Relevant past medical, surgical, family and social history reviewed and updated as indicated. Interim medical history since our last visit reviewed. Allergies and medications reviewed and updated. Outpatient Medications Prior to Visit  Medication Sig Dispense Refill   cetirizine  (ZYRTEC ) 10 MG tablet Take 1 tablet (10 mg total) by mouth daily. (Patient taking differently: Take 10 mg by mouth daily as needed.) 30 tablet 11   GINSENG PO Take 1 capsule by mouth daily. Takes 500 mg capsule     Green Tea 250 MG CAPS Take by mouth daily.     loratadine (CLARITIN) 10 MG tablet Take 10 mg by mouth daily as needed.      Melatonin 10 MG TABS Take 1 tablet by  mouth daily as needed.     MISC NATURAL PRODUCTS PO Take by mouth daily. Caralluma     Multiple Vitamins-Minerals (CENTRUM ADULT PO) Take by mouth daily.     Probiotic Product (PROBIOTIC PO) Take by mouth daily.     atomoxetine  (STRATTERA ) 40 MG capsule TAKE 1 CAPSULE (40 MG TOTAL) BY MOUTH DAILY. 90 capsule 2   metFORMIN  (GLUCOPHAGE ) 500 MG tablet TAKE 1 TABLET BY MOUTH EVERY DAY WITH BREAKFAST 90 tablet 0   No facility-administered medications prior to visit.     Per HPI unless specifically indicated in ROS section below Review of Systems  Constitutional:  Negative for activity change, appetite change, chills, fatigue, fever and unexpected weight change.  HENT:  Negative for hearing loss.   Eyes:  Negative for visual disturbance.  Respiratory:  Negative for cough, chest tightness, shortness of breath and wheezing.   Cardiovascular:  Negative for chest pain, palpitations and leg swelling.  Gastrointestinal:  Negative for abdominal distention, abdominal pain, blood in stool, constipation, diarrhea, nausea and vomiting.  Genitourinary:  Negative for difficulty urinating and hematuria.  Musculoskeletal:  Negative for arthralgias, myalgias and neck pain.  Skin:  Negative for rash.  Neurological:  Negative for dizziness, seizures, syncope and headaches.  Hematological:  Negative for adenopathy. Does not bruise/bleed easily.  Psychiatric/Behavioral:  Negative for dysphoric  mood. The patient is not nervous/anxious.     Objective:  BP 120/82   Pulse 100   Temp 97.9 F (36.6 C) (Oral)   Ht 5' 8 (1.727 m)   Wt 182 lb 4 oz (82.7 kg)   SpO2 98%   BMI 27.71 kg/m   Wt Readings from Last 3 Encounters:  03/05/23 182 lb 4 oz (82.7 kg)  12/14/22 185 lb (83.9 kg)  12/05/22 185 lb (83.9 kg)      Physical Exam Vitals and nursing note reviewed.  Constitutional:      General: He is not in acute distress.    Appearance: Normal appearance. He is well-developed. He is not ill-appearing.  HENT:      Head: Normocephalic and atraumatic.     Right Ear: Hearing, tympanic membrane, ear canal and external ear normal.     Left Ear: Hearing, tympanic membrane, ear canal and external ear normal.     Mouth/Throat:     Mouth: Mucous membranes are moist.     Pharynx: Oropharynx is clear. No oropharyngeal exudate or posterior oropharyngeal erythema.  Eyes:     General: No scleral icterus.    Extraocular Movements: Extraocular movements intact.     Conjunctiva/sclera: Conjunctivae normal.     Pupils: Pupils are equal, round, and reactive to light.  Neck:     Thyroid : No thyroid  mass or thyromegaly.  Cardiovascular:     Rate and Rhythm: Normal rate and regular rhythm.     Pulses: Normal pulses.          Radial pulses are 2+ on the right side and 2+ on the left side.     Heart sounds: Normal heart sounds. No murmur heard. Pulmonary:     Effort: Pulmonary effort is normal. No respiratory distress.     Breath sounds: Normal breath sounds. No wheezing, rhonchi or rales.  Abdominal:     General: Bowel sounds are normal. There is no distension.     Palpations: Abdomen is soft. There is no mass.     Tenderness: There is no abdominal tenderness. There is no guarding or rebound.     Hernia: No hernia is present.  Musculoskeletal:        General: Normal range of motion.     Cervical back: Normal range of motion and neck supple.     Right lower leg: No edema.     Left lower leg: No edema.  Lymphadenopathy:     Cervical: No cervical adenopathy.  Skin:    General: Skin is warm and dry.     Findings: No rash.  Neurological:     General: No focal deficit present.     Mental Status: He is alert and oriented to person, place, and time.  Psychiatric:        Mood and Affect: Mood normal.        Behavior: Behavior normal.        Thought Content: Thought content normal.        Judgment: Judgment normal.       Results for orders placed or performed in visit on 02/18/23  Vitamin B12   Collection  Time: 02/18/23  9:29 AM  Result Value Ref Range   Vitamin B-12 284 211 - 911 pg/mL  Hemoglobin A1c   Collection Time: 02/18/23  9:29 AM  Result Value Ref Range   Hgb A1c MFr Bld 6.4 4.6 - 6.5 %  Comprehensive metabolic panel   Collection Time: 02/18/23  9:29 AM  Result Value Ref Range   Sodium 138 135 - 145 mEq/L   Potassium 4.1 3.5 - 5.1 mEq/L   Chloride 101 96 - 112 mEq/L   CO2 30 19 - 32 mEq/L   Glucose, Bld 108 (H) 70 - 99 mg/dL   BUN 13 6 - 23 mg/dL   Creatinine, Ser 9.06 0.40 - 1.50 mg/dL   Total Bilirubin 0.8 0.2 - 1.2 mg/dL   Alkaline Phosphatase 68 39 - 117 U/L   AST 20 0 - 37 U/L   ALT 30 0 - 53 U/L   Total Protein 7.1 6.0 - 8.3 g/dL   Albumin 4.5 3.5 - 5.2 g/dL   GFR 01.92 >39.99 mL/min   Calcium  9.2 8.4 - 10.5 mg/dL  Lipid panel   Collection Time: 02/18/23  9:29 AM  Result Value Ref Range   Cholesterol 217 (H) 0 - 200 mg/dL   Triglycerides 14.9 0.0 - 149.0 mg/dL   HDL 53.89 >60.99 mg/dL   VLDL 82.9 0.0 - 59.9 mg/dL   LDL Cholesterol 845 (H) 0 - 99 mg/dL   Total CHOL/HDL Ratio 5    NonHDL 171.19     Assessment & Plan:   Problem List Items Addressed This Visit     Health maintenance examination - Primary (Chronic)   Preventative protocols reviewed and updated unless pt declined. Discussed healthy diet and lifestyle.       Hyperlipidemia associated with type 2 diabetes mellitus (HCC)   Chronic off statin, above goal in diabetic - will start atorvastatin  20mg  daily, reassess control at 6 mo DM f/u visit.  The 10-year ASCVD risk score (Arnett DK, et al., 2019) is: 5.4%   Values used to calculate the score:     Age: 48 years     Sex: Male     Is Non-Hispanic African American: No     Diabetic: Yes     Tobacco smoker: No     Systolic Blood Pressure: 120 mmHg     Is BP treated: No     HDL Cholesterol: 46.1 mg/dL     Total Cholesterol: 217 mg/dL       Relevant Medications   metFORMIN  (GLUCOPHAGE ) 500 MG tablet   atorvastatin  (LIPITOR) 20 MG tablet    Allergic rhinitis   Stable period with PRN antihistamine.       Inattention   Stable period on Strattera  40mg  daily - continue current dose.       Relevant Medications   atomoxetine  (STRATTERA ) 40 MG capsule   Type 2 diabetes mellitus with other specified complication (HCC)   Chronic, controlled on low dose metformin . Start statin in diabetic.  RTC 6 mo DM f/u visit.       Relevant Medications   metFORMIN  (GLUCOPHAGE ) 500 MG tablet   atorvastatin  (LIPITOR) 20 MG tablet   Overweight with body mass index (BMI) 25.0-29.9   Continue to encourage healthy diet and lifestyle .      Insomnia   Mixed initiation/maintenance insomnia despite PRN melatonin. Discussed trial L-theanine prn night time awakenings.       Tachycardia   Chronic, overall stable. Strattera  could worsen this, but tachycardia predates initiation of strattera       Low serum vitamin B12   Start b12 500-1000mcg daily x 2 wks then MWF dosing.         Meds ordered this encounter  Medications   atomoxetine  (STRATTERA ) 40 MG capsule    Sig: Take 1 capsule (40 mg total) by mouth daily.  Dispense:  90 capsule    Refill:  4   metFORMIN  (GLUCOPHAGE ) 500 MG tablet    Sig: Take 1 tablet (500 mg total) by mouth daily with breakfast.    Dispense:  90 tablet    Refill:  4   atorvastatin  (LIPITOR) 20 MG tablet    Sig: Take 1 tablet (20 mg total) by mouth daily.    Dispense:  90 tablet    Refill:  4   cyanocobalamin  1000 MCG tablet    Sig: Take 1 tablet (1,000 mcg total) by mouth every Monday, Wednesday, and Friday.    No orders of the defined types were placed in this encounter.   Patient Instructions  Start atorvastatin  20mg  daily Starting b12 500-1000mcg daily for 2 wks then MWF dosing.  Good to see you today Return in 6 months for follow up visit    Follow up plan: Return in about 6 months (around 09/02/2023) for follow up visit.  Anton Blas, MD

## 2023-03-05 NOTE — Assessment & Plan Note (Signed)
 Chronic, controlled on low dose metformin. Start statin in diabetic.  RTC 6 mo DM f/u visit.

## 2023-03-05 NOTE — Assessment & Plan Note (Addendum)
 Chronic off statin, above goal in diabetic - will start atorvastatin  20mg  daily, reassess control at 6 mo DM f/u visit.  The 10-year ASCVD risk score (Arnett DK, et al., 2019) is: 5.4%   Values used to calculate the score:     Age: 48 years     Sex: Male     Is Non-Hispanic African American: No     Diabetic: Yes     Tobacco smoker: No     Systolic Blood Pressure: 120 mmHg     Is BP treated: No     HDL Cholesterol: 46.1 mg/dL     Total Cholesterol: 217 mg/dL

## 2023-03-05 NOTE — Assessment & Plan Note (Signed)
 Stable period with PRN antihistamine.

## 2023-03-05 NOTE — Assessment & Plan Note (Signed)
 Start b12 500-1062mcg daily x 2 wks then MWF dosing.

## 2023-03-05 NOTE — Assessment & Plan Note (Signed)
 Mixed initiation/maintenance insomnia despite PRN melatonin. Discussed trial L-theanine prn night time awakenings.

## 2023-03-05 NOTE — Assessment & Plan Note (Signed)
 Preventative protocols reviewed and updated unless pt declined. Discussed healthy diet and lifestyle.

## 2023-03-05 NOTE — Assessment & Plan Note (Signed)
 Chronic, overall stable. Strattera could worsen this, but tachycardia predates initiation of strattera

## 2023-03-05 NOTE — Assessment & Plan Note (Signed)
Continue to encourage healthy diet and lifestyle

## 2023-03-05 NOTE — Assessment & Plan Note (Addendum)
 Stable period on Strattera 40mg  daily - continue current dose.

## 2023-03-05 NOTE — Patient Instructions (Addendum)
 Start atorvastatin 20mg  daily Starting b12 500-1069mcg daily for 2 wks then MWF dosing.  Good to see you today Return in 6 months for follow up visit

## 2023-03-14 ENCOUNTER — Encounter: Payer: Self-pay | Admitting: Family Medicine

## 2023-09-02 ENCOUNTER — Encounter: Payer: Self-pay | Admitting: Family Medicine

## 2023-09-02 ENCOUNTER — Ambulatory Visit: Payer: 59 | Admitting: Family Medicine

## 2023-09-02 VITALS — BP 132/86 | HR 62 | Temp 98.0°F | Ht 68.0 in | Wt 182.5 lb

## 2023-09-02 DIAGNOSIS — E1169 Type 2 diabetes mellitus with other specified complication: Secondary | ICD-10-CM

## 2023-09-02 DIAGNOSIS — E785 Hyperlipidemia, unspecified: Secondary | ICD-10-CM

## 2023-09-02 DIAGNOSIS — G4700 Insomnia, unspecified: Secondary | ICD-10-CM | POA: Diagnosis not present

## 2023-09-02 DIAGNOSIS — Z7984 Long term (current) use of oral hypoglycemic drugs: Secondary | ICD-10-CM | POA: Diagnosis not present

## 2023-09-02 LAB — POCT GLYCOSYLATED HEMOGLOBIN (HGB A1C): Hemoglobin A1C: 6.1 % — AB (ref 4.0–5.6)

## 2023-09-02 MED ORDER — TRAZODONE HCL 50 MG PO TABS: 25.0000 mg | ORAL_TABLET | Freq: Every evening | ORAL | 3 refills | Status: DC | PRN

## 2023-09-02 MED ORDER — THEANINE 100 100 MG PO CAPS: 1.0000 | ORAL_CAPSULE | Freq: Every evening | ORAL | Status: AC | PRN

## 2023-09-02 NOTE — Assessment & Plan Note (Addendum)
 Both sleep initiation and maintenance insomnia. Reviewed sleep hygiene measures, bedtime routine checklist provided. Continue PRN melatonin, L-theanine, will also start trazodone  25-50mg  at night PRN.  Denies daytime somnolence. Endorses restful sleep.

## 2023-09-02 NOTE — Assessment & Plan Note (Addendum)
 Statin started 02/2023 with LDL 154. In diabetes, goal <100. Check FLP next fasting labs.

## 2023-09-02 NOTE — Patient Instructions (Addendum)
 Sugars are doing great! Try trazodone  50mg  1/2-1 tablet for sleep as needed.  Return in 6 months for physical.  Bedtime routine checklist: 1. Avoid naps during the day 2. Avoid stimulants such as caffeine and nicotine. Avoid bedtime alcohol (it can speed onset of sleep but the body's metabolism can cause awakenings). 3. All forms of exercise help ensure sound sleep - limit vigorous exercise to morning or late afternoon 4. Avoid food too close to bedtime including chocolate (which contains caffeine) 5. Soak up natural light 6. Establish regular bedtime routine. 7. Associate bed with sleep - avoid TV, computer or phone, reading while in bed. 8. Ensure pleasant, relaxing sleep environment - quiet, dark, cool room.

## 2023-09-02 NOTE — Progress Notes (Signed)
 Ph: [phone] Fax: [phone]   Patient ID: Grant [name], male    DOB: Sep 18, 1975, 48 y.o.   MRN: [id number]  This visit was conducted in person.  BP 132/86   Pulse 62   Temp 98 F (36.7 C) (Oral)   Ht 5' 8 (1.727 m)   Wt 182 lb 8 oz (82.8 kg)   SpO2 98%   BMI 27.75 kg/m    CC: 6 mo DM f/u visit  Subjective:   HPI: Grant [name] is a 48 y.o. male presenting on 09/02/2023 for Medical Management of Chronic Issues (Here for 6 mo DM f/u.)   Insomnia - both sleep initiation and sleep maintenance trouble, despite melatonin 10mg  PRN (rare use) and L-theanine 100mg  PRN. Bedtime 11pm-12am, takes 30-60 min to fall asleep. Reviewed sleep hygiene.   DM - does not regularly check sugars. Compliant with antihyperglycemic regimen which includes: metformin  500mg  daily. Denies low sugars or hypoglycemic symptoms. Denies paresthesias, blurry vision. Last diabetic eye exam: [facility] 07/2022. [facility] year [facility] near [place] - but did not have DR screen. Glucometer brand: doesn't have one. Last foot exam: [facility]. DSME: has not had this done.  Lab Results  Component Value Date   HGBA1C 6.1 (A) 09/02/2023   Diabetic Foot Exam - Simple   Simple Foot Form Diabetic Foot exam was performed with the following findings: Yes 09/02/2023  8:50 AM  Visual Inspection No deformities, no ulcerations, no other skin breakdown bilaterally: Yes Sensation Testing Intact to touch and monofilament testing bilaterally: Yes Pulse Check Posterior Tibialis and Dorsalis pulse intact bilaterally: Yes Comments No claudication    No results found for: MACKEY CURRENT       Relevant past medical, surgical, family and social history reviewed and updated as indicated. Interim medical history since our last visit reviewed. Allergies and medications reviewed and updated. Outpatient Medications Prior to Visit  Medication Sig Dispense Refill   atomoxetine  (STRATTERA ) 40 MG capsule Take  1 capsule (40 mg total) by mouth daily. 90 capsule 4   atorvastatin  (LIPITOR) 20 MG tablet Take 1 tablet (20 mg total) by mouth daily. 90 tablet 4   cetirizine  (ZYRTEC ) 10 MG tablet Take 1 tablet (10 mg total) by mouth daily. (Patient taking differently: Take 10 mg by mouth daily as needed.) 30 tablet 11   cyanocobalamin  1000 MCG tablet Take 1 tablet (1,000 mcg total) by mouth every Monday, Wednesday, and Friday.     GINSENG PO Take 1 capsule by mouth daily. Takes 500 mg capsule     Green Tea 250 MG CAPS Take by mouth daily.     loratadine (CLARITIN) 10 MG tablet Take 10 mg by mouth daily as needed.      Melatonin 10 MG TABS Take 1 tablet by mouth daily as needed.     metFORMIN  (GLUCOPHAGE ) 500 MG tablet Take 1 tablet (500 mg total) by mouth daily with breakfast. 90 tablet 4   MISC NATURAL PRODUCTS PO Take by mouth daily. Caralluma     Multiple Vitamins-Minerals (CENTRUM ADULT PO) Take by mouth daily.     Probiotic Product (PROBIOTIC PO) Take by mouth daily.     No facility-administered medications prior to visit.     Per HPI unless specifically indicated in ROS section below Review of Systems  Objective:  BP 132/86   Pulse 62   Temp 98 F (36.7 C) (Oral)   Ht 5' 8 (1.727 m)   Wt 182 lb 8 oz (82.8 kg)  SpO2 98%   BMI 27.75 kg/m   Wt Readings from Last 3 Encounters:  09/02/23 182 lb 8 oz (82.8 kg)  03/05/23 182 lb 4 oz (82.7 kg)  12/14/22 185 lb (83.9 kg)      Physical Exam Vitals and nursing note reviewed.  Constitutional:      Appearance: Normal appearance. He is not ill-appearing.  Eyes:     Extraocular Movements: Extraocular movements intact.     Conjunctiva/sclera: Conjunctivae normal.     Pupils: Pupils are equal, round, and reactive to light.  Cardiovascular:     Rate and Rhythm: Normal rate and regular rhythm.     Pulses: Normal pulses.     Heart sounds: Normal heart sounds. No murmur heard. Pulmonary:     Effort: Pulmonary effort is normal. No respiratory  distress.     Breath sounds: Normal breath sounds. No wheezing, rhonchi or rales.  Musculoskeletal:     Right lower leg: No edema.     Left lower leg: No edema.     Comments: See HPI for foot exam if done  Skin:    General: Skin is warm and dry.     Findings: No rash.  Neurological:     Mental Status: He is alert.  Psychiatric:        Mood and Affect: Mood normal.        Behavior: Behavior normal.       Results for orders placed or performed in visit on 09/02/23  POCT glycosylated hemoglobin (Hb A1C)   Collection Time: 09/02/23  8:32 AM  Result Value Ref Range   Hemoglobin A1C 6.1 (A) 4.0 - 5.6 %   HbA1c POC (<> result, manual entry)     HbA1c, POC (prediabetic range)     HbA1c, POC (controlled diabetic range)     Lab Results  Component Value Date   CHOL 217 (H) 02/18/2023   HDL 46.10 02/18/2023   LDLCALC 154 (H) 02/18/2023   LDLDIRECT 179.2 08/06/2007   TRIG 85.0 02/18/2023   CHOLHDL 5 02/18/2023   Assessment & Plan:   Problem List Items Addressed This Visit     Hyperlipidemia associated with type 2 diabetes mellitus (HCC)   Statin started 02/2023 with LDL 154. In diabetes, goal <100. Check FLP next fasting labs.        Type 2 diabetes mellitus with other specified complication (HCC) - Primary   Chronic, great control on low dose metformin  - continue.  Reassess at CPE in 6 months.       Relevant Orders   POCT glycosylated hemoglobin (Hb A1C) (Completed)   Insomnia   Both sleep initiation and maintenance insomnia. Reviewed sleep hygiene measures, bedtime routine checklist provided. Continue PRN melatonin, L-theanine, will also start trazodone  25-50mg  at night PRN.  Denies daytime somnolence. Endorses restful sleep.        Meds ordered this encounter  Medications   Theanine (THEANINE 100) 100 MG CAPS    Sig: Take 1 capsule by mouth at bedtime as needed (nighttime awakenings).   traZODone  (DESYREL ) 50 MG tablet    Sig: Take 0.5-1 tablets (25-50 mg total)  by mouth at bedtime as needed for sleep.    Dispense:  30 tablet    Refill:  3    Orders Placed This Encounter  Procedures   POCT glycosylated hemoglobin (Hb A1C)    Patient Instructions  Sugars are doing great! Try trazodone  50mg  1/2-1 tablet for sleep as needed.  Return in 6 months for  physical.  Bedtime routine checklist: 1. Avoid naps during the day 2. Avoid stimulants such as caffeine and nicotine. Avoid bedtime alcohol (it can speed onset of sleep but the body's metabolism can cause awakenings). 3. All forms of exercise help ensure sound sleep - limit vigorous exercise to morning or late afternoon 4. Avoid food too close to bedtime including chocolate (which contains caffeine) 5. Soak up natural light 6. Establish regular bedtime routine. 7. Associate bed with sleep - avoid TV, computer or phone, reading while in bed. 8. Ensure pleasant, relaxing sleep environment - quiet, dark, cool room.   Follow up plan: Return in about 6 months (around 03/04/2024) for annual exam, prior fasting for blood work.  Anton Blas, MD

## 2023-09-02 NOTE — Assessment & Plan Note (Signed)
 Chronic, great control on low dose metformin  - continue.  Reassess at CPE in 6 months.

## 2023-09-26 ENCOUNTER — Other Ambulatory Visit: Payer: Self-pay | Admitting: Family Medicine

## 2023-09-27 NOTE — Telephone Encounter (Signed)
 Pharmacy is requesting a 90 day Rx, last filled by PCP on 09/02/23 #30 tabs/ 3 refill, please advise   CPE scheduled 03/18/24

## 2023-09-29 NOTE — Telephone Encounter (Signed)
 ERx

## 2024-03-09 ENCOUNTER — Other Ambulatory Visit: Payer: Self-pay | Admitting: Family Medicine

## 2024-03-09 DIAGNOSIS — E1169 Type 2 diabetes mellitus with other specified complication: Secondary | ICD-10-CM

## 2024-03-09 DIAGNOSIS — E538 Deficiency of other specified B group vitamins: Secondary | ICD-10-CM

## 2024-03-11 ENCOUNTER — Other Ambulatory Visit

## 2024-03-11 DIAGNOSIS — E1169 Type 2 diabetes mellitus with other specified complication: Secondary | ICD-10-CM | POA: Diagnosis not present

## 2024-03-11 DIAGNOSIS — E538 Deficiency of other specified B group vitamins: Secondary | ICD-10-CM | POA: Diagnosis not present

## 2024-03-11 DIAGNOSIS — E785 Hyperlipidemia, unspecified: Secondary | ICD-10-CM | POA: Diagnosis not present

## 2024-03-11 LAB — COMPREHENSIVE METABOLIC PANEL WITH GFR
ALT: 27 U/L (ref 3–53)
AST: 17 U/L (ref 5–37)
Albumin: 4.7 g/dL (ref 3.5–5.2)
Alkaline Phosphatase: 66 U/L (ref 39–117)
BUN: 17 mg/dL (ref 6–23)
CO2: 31 meq/L (ref 19–32)
Calcium: 9.2 mg/dL (ref 8.4–10.5)
Chloride: 101 meq/L (ref 96–112)
Creatinine, Ser: 0.95 mg/dL (ref 0.40–1.50)
GFR: 94.89 mL/min
Glucose, Bld: 115 mg/dL — ABNORMAL HIGH (ref 70–99)
Potassium: 4.3 meq/L (ref 3.5–5.1)
Sodium: 137 meq/L (ref 135–145)
Total Bilirubin: 0.8 mg/dL (ref 0.2–1.2)
Total Protein: 7.4 g/dL (ref 6.0–8.3)

## 2024-03-11 LAB — LIPID PANEL
Cholesterol: 142 mg/dL (ref 28–200)
HDL: 41.6 mg/dL
LDL Cholesterol: 76 mg/dL (ref 10–99)
NonHDL: 100.3
Total CHOL/HDL Ratio: 3
Triglycerides: 122 mg/dL (ref 10.0–149.0)
VLDL: 24.4 mg/dL (ref 0.0–40.0)

## 2024-03-11 LAB — HEMOGLOBIN A1C: Hgb A1c MFr Bld: 6.1 % (ref 4.6–6.5)

## 2024-03-11 LAB — MICROALBUMIN / CREATININE URINE RATIO
Creatinine,U: 85 mg/dL
Microalb Creat Ratio: UNDETERMINED mg/g (ref 0.0–30.0)
Microalb, Ur: <0.7 mg/dL

## 2024-03-11 LAB — VITAMIN B12: Vitamin B-12: 637 pg/mL (ref 211–911)

## 2024-03-14 ENCOUNTER — Ambulatory Visit: Payer: Self-pay | Admitting: Family Medicine

## 2024-03-18 ENCOUNTER — Encounter: Payer: Self-pay | Admitting: Family Medicine

## 2024-03-18 ENCOUNTER — Ambulatory Visit: Admitting: Family Medicine

## 2024-03-18 VITALS — BP 122/86 | HR 77 | Temp 98.0°F | Ht 68.0 in | Wt 182.0 lb

## 2024-03-18 DIAGNOSIS — G4700 Insomnia, unspecified: Secondary | ICD-10-CM

## 2024-03-18 DIAGNOSIS — E785 Hyperlipidemia, unspecified: Secondary | ICD-10-CM | POA: Diagnosis not present

## 2024-03-18 DIAGNOSIS — E1169 Type 2 diabetes mellitus with other specified complication: Secondary | ICD-10-CM | POA: Diagnosis not present

## 2024-03-18 DIAGNOSIS — L601 Onycholysis: Secondary | ICD-10-CM

## 2024-03-18 DIAGNOSIS — E538 Deficiency of other specified B group vitamins: Secondary | ICD-10-CM | POA: Diagnosis not present

## 2024-03-18 DIAGNOSIS — Z0001 Encounter for general adult medical examination with abnormal findings: Secondary | ICD-10-CM

## 2024-03-18 DIAGNOSIS — Z7984 Long term (current) use of oral hypoglycemic drugs: Secondary | ICD-10-CM | POA: Diagnosis not present

## 2024-03-18 DIAGNOSIS — R41840 Attention and concentration deficit: Secondary | ICD-10-CM | POA: Diagnosis not present

## 2024-03-18 DIAGNOSIS — Z0000 Encounter for general adult medical examination without abnormal findings: Secondary | ICD-10-CM

## 2024-03-18 MED ORDER — ATORVASTATIN CALCIUM 20 MG PO TABS: 20.0000 mg | ORAL_TABLET | Freq: Every day | ORAL | 3 refills | Status: AC

## 2024-03-18 MED ORDER — METFORMIN HCL 500 MG PO TABS: 500.0000 mg | ORAL_TABLET | Freq: Every day | ORAL | 3 refills | Status: AC

## 2024-03-18 MED ORDER — TRAZODONE HCL 50 MG PO TABS: 50.0000 mg | ORAL_TABLET | Freq: Every evening | ORAL | 1 refills | Status: AC | PRN

## 2024-03-18 MED ORDER — ATOMOXETINE HCL 40 MG PO CAPS: 40.0000 mg | ORAL_CAPSULE | Freq: Every day | ORAL | 3 refills | Status: AC

## 2024-03-18 NOTE — Assessment & Plan Note (Signed)
 Preventative protocols reviewed and updated unless pt declined. Discussed healthy diet and lifestyle.

## 2024-03-18 NOTE — Assessment & Plan Note (Signed)
 Chronic, mixed initiation/maintenance insomnia, stable period on trazodone , melatonin, L-theanine PRN

## 2024-03-18 NOTE — Assessment & Plan Note (Signed)
 Chronic, great control on current regimen - continue low dose metformin .

## 2024-03-18 NOTE — Assessment & Plan Note (Signed)
 Stable period on Strattera  40mg  daily - continue.

## 2024-03-18 NOTE — Patient Instructions (Addendum)
Continue current medicines.  You are doing well today Return as needed or in 1 year for next physical

## 2024-03-18 NOTE — Assessment & Plan Note (Addendum)
 Chronic, significant improvement with atorva commencement last year - continue. Goal LDL <100 in diabetes history.  The 10-year ASCVD risk score (Arnett DK, et al., 2019) is: 3.6%   Values used to calculate the score:     Age: 49 years     Clinically relevant sex: Male     Is Non-Hispanic African American: No     Diabetic: Yes     Tobacco smoker: No     Systolic Blood Pressure: 122 mmHg     Is BP treated: No     HDL Cholesterol: 41.6 mg/dL     Total Cholesterol: 142 mg/dL

## 2024-03-18 NOTE — Assessment & Plan Note (Signed)
 Funginail x 6 months was beneficial but onycholysis to bilat great toenails persists.  Discussed possible etiologies including feet staying wet, onychomycosis.  Will refer to podiatry for further eval, consider culture prior to further oral antifungal treatment.

## 2024-03-18 NOTE — Assessment & Plan Note (Signed)
Continue b12 replacement MWF

## 2024-03-18 NOTE — Progress Notes (Signed)
 " Ph: [phone] Fax: [phone]   Patient ID: Grant [name], male    DOB: 1975-09-09, 49 y.o.   MRN: [id number]  This visit was conducted in person.  BP 122/86 (BP Location: Left Arm, Patient Position: Sitting, Cuff Size: Normal)   Pulse 77   Temp 98 F (36.7 C) (Oral)   Ht 5' 8 (1.727 m)   Wt 182 lb (82.6 kg)   SpO2 100%   BMI 27.67 kg/m    CC: CPE Subjective:   HPI: Grant [name] is a 49 y.o. male presenting on 03/18/2024 for Acute Visit (Pt worried about fungal infection around both big toes, onset 2 years, OTC not working)   ADD - stable period on Strattera  40mg  daily. ?tachycardia side effect. Denies palpitations.  DM - stable period on metformin  500mg  daily.   Insomnia - both sleep initiation and sleep maintenance trouble, despite melatonin 10mg  PRN (rare use). L-theanine beneficial, last visit started trazodone  50mg  PRN - feels this is helpful.   R>L great toenail issue - using funginail for 6-8 months with plateau in improvement. He notes regularly using crocs at home - feet may stay wet due to this.   Preventative: Colonoscopy 11/2022 - WNL, rpt 10 yrs ([facility])  Prostate cancer screening - no fmhx. No weakening stream. Nocturia x1.  Flu shot yearly COVID vaccine - Pfizer 03/2019, 05/2019, booster x1  Tdap - 09/11/2016 Hep B series completed Seat belt use discussed Sunscreen use discussed. No changing moles on skin.  Sleep - averaging 6-7 hours/night  Non smoker  Alcohol - rare  Dentist - yearly  Eye exam - yearly  Bowel - no constipation   Lives with wife (also pharmacist) and daughter Occ: Pharmacist at [employer]  Activity: exercising at home, walking on treadmill, push ups  Diet: lots of rice, fruits/vegetables, good water intake     Relevant past medical, surgical, family and social history reviewed and updated as indicated. Interim medical history since our last visit reviewed. Allergies and medications reviewed and updated. Outpatient  Medications Prior to Visit  Medication Sig Dispense Refill   cetirizine  (ZYRTEC ) 10 MG tablet Take 1 tablet (10 mg total) by mouth daily. (Patient taking differently: Take 10 mg by mouth daily as needed.) 30 tablet 11   cyanocobalamin  1000 MCG tablet Take 1 tablet (1,000 mcg total) by mouth every Monday, Wednesday, and Friday.     GINSENG PO Take 1 capsule by mouth daily. Takes 500 mg capsule     Green Tea 250 MG CAPS Take by mouth daily.     loratadine (CLARITIN) 10 MG tablet Take 10 mg by mouth daily as needed.      Melatonin 10 MG TABS Take 1 tablet by mouth daily as needed.     MISC NATURAL PRODUCTS PO Take by mouth daily. [place]     Multiple Vitamins-Minerals (CENTRUM ADULT PO) Take by mouth daily.     Probiotic Product (PROBIOTIC PO) Take by mouth daily.     Theanine (THEANINE 100) 100 MG CAPS Take 1 capsule by mouth at bedtime as needed (nighttime awakenings).     atomoxetine  (STRATTERA ) 40 MG capsule Take 1 capsule (40 mg total) by mouth daily. 90 capsule 4   atorvastatin  (LIPITOR) 20 MG tablet Take 1 tablet (20 mg total) by mouth daily. 90 tablet 4   metFORMIN  (GLUCOPHAGE ) 500 MG tablet Take 1 tablet (500 mg total) by mouth daily with breakfast. 90 tablet 4   traZODone  (DESYREL ) 50 MG tablet TAKE 0.5-1 TABLETS  BY MOUTH AT BEDTIME AS NEEDED FOR SLEEP. 90 tablet 3   No facility-administered medications prior to visit.     Per HPI unless specifically indicated in ROS section below Review of Systems  Constitutional:  Negative for activity change, appetite change, chills, fatigue, fever and unexpected weight change.  HENT:  Negative for hearing loss.   Eyes:  Negative for visual disturbance.  Respiratory:  Negative for cough, chest tightness, shortness of breath and wheezing.   Cardiovascular:  Negative for chest pain, palpitations and leg swelling.  Gastrointestinal:  Negative for abdominal distention, abdominal pain, blood in stool, constipation, diarrhea, nausea and vomiting.   Genitourinary:  Negative for difficulty urinating and hematuria.  Musculoskeletal:  Negative for arthralgias, myalgias and neck pain.  Skin:  Negative for rash.  Neurological:  Negative for dizziness, seizures, syncope and headaches.  Hematological:  Negative for adenopathy. Does not bruise/bleed easily.  Psychiatric/Behavioral:  Negative for dysphoric mood. The patient is not nervous/anxious.     Objective:  BP 122/86 (BP Location: Left Arm, Patient Position: Sitting, Cuff Size: Normal)   Pulse 77   Temp 98 F (36.7 C) (Oral)   Ht 5' 8 (1.727 m)   Wt 182 lb (82.6 kg)   SpO2 100%   BMI 27.67 kg/m   Wt Readings from Last 3 Encounters:  03/18/24 182 lb (82.6 kg)  09/02/23 182 lb 8 oz (82.8 kg)  03/05/23 182 lb 4 oz (82.7 kg)      Physical Exam Vitals and nursing note reviewed.  Constitutional:      General: He is not in acute distress.    Appearance: Normal appearance. He is well-developed. He is not ill-appearing.  HENT:     Head: Normocephalic and atraumatic.     Right Ear: Hearing, tympanic membrane, ear canal and external ear normal.     Left Ear: Hearing, tympanic membrane, ear canal and external ear normal.     Mouth/Throat:     Mouth: Mucous membranes are moist.     Pharynx: Oropharynx is clear. No oropharyngeal exudate or posterior oropharyngeal erythema.  Eyes:     General: No scleral icterus.    Extraocular Movements: Extraocular movements intact.     Conjunctiva/sclera: Conjunctivae normal.     Pupils: Pupils are equal, round, and reactive to light.  Neck:     Thyroid : No thyroid  mass or thyromegaly.  Cardiovascular:     Rate and Rhythm: Normal rate and regular rhythm.     Pulses: Normal pulses.          Radial pulses are 2+ on the right side and 2+ on the left side.     Heart sounds: Normal heart sounds. No murmur heard. Pulmonary:     Effort: Pulmonary effort is normal. No respiratory distress.     Breath sounds: Normal breath sounds. No wheezing,  rhonchi or rales.  Abdominal:     General: Bowel sounds are normal. There is no distension.     Palpations: Abdomen is soft. There is no mass.     Tenderness: There is no abdominal tenderness. There is no guarding or rebound.     Hernia: No hernia is present.  Musculoskeletal:        General: Normal range of motion.     Cervical back: Normal range of motion and neck supple.     Right lower leg: No edema.     Left lower leg: No edema.  Lymphadenopathy:     Cervical: No cervical adenopathy.  Skin:  General: Skin is warm and dry.     Findings: No rash.     Comments:  Bilateral lateral great toe onycholysis without significant nail thickening   Neurological:     General: No focal deficit present.     Mental Status: He is alert and oriented to person, place, and time.  Psychiatric:        Mood and Affect: Mood normal.        Behavior: Behavior normal.        Thought Content: Thought content normal.        Judgment: Judgment normal.       Results for orders placed or performed in visit on 03/11/24  Vitamin B12   Collection Time: 03/11/24  7:40 AM  Result Value Ref Range   Vitamin B-12 637 211 - 911 pg/mL  Microalbumin / creatinine urine ratio   Collection Time: 03/11/24  7:40 AM  Result Value Ref Range   Microalb, Ur <0.7 mg/dL   Creatinine,U 14.9 mg/dL   Microalb Creat Ratio Unable to calculate 0.0 - 30.0 mg/g  Hemoglobin A1c   Collection Time: 03/11/24  7:40 AM  Result Value Ref Range   Hgb A1c MFr Bld 6.1 4.6 - 6.5 %  Comprehensive metabolic panel with GFR   Collection Time: 03/11/24  7:40 AM  Result Value Ref Range   Sodium 137 135 - 145 mEq/L   Potassium 4.3 3.5 - 5.1 mEq/L   Chloride 101 96 - 112 mEq/L   CO2 31 19 - 32 mEq/L   Glucose, Bld 115 (H) 70 - 99 mg/dL   BUN 17 6 - 23 mg/dL   Creatinine, Ser 9.04 0.40 - 1.50 mg/dL   Total Bilirubin 0.8 0.2 - 1.2 mg/dL   Alkaline Phosphatase 66 39 - 117 U/L   AST 17 5 - 37 U/L   ALT 27 3 - 53 U/L   Total Protein 7.4  6.0 - 8.3 g/dL   Albumin 4.7 3.5 - 5.2 g/dL   GFR 05.10 >39.99 mL/min   Calcium  9.2 8.4 - 10.5 mg/dL  Lipid panel   Collection Time: 03/11/24  7:40 AM  Result Value Ref Range   Cholesterol 142 28 - 200 mg/dL   Triglycerides 877.9 89.9 - 149.0 mg/dL   HDL 58.39 >60.99 mg/dL   VLDL 75.5 0.0 - 59.9 mg/dL   LDL Cholesterol 76 10 - 99 mg/dL   Total CHOL/HDL Ratio 3    NonHDL 100.30     Assessment & Plan:   Problem List Items Addressed This Visit     Health maintenance examination - Primary (Chronic)   Preventative protocols reviewed and updated unless pt declined. Discussed healthy diet and lifestyle.       Hyperlipidemia associated with type 2 diabetes mellitus (HCC)   Chronic, significant improvement with atorva commencement last year - continue. Goal LDL <100 in diabetes history.  The 10-year ASCVD risk score (Arnett DK, et al., 2019) is: 3.6%   Values used to calculate the score:     Age: 42 years     Clinically relevant sex: Male     Is Non-Hispanic African American: No     Diabetic: Yes     Tobacco smoker: No     Systolic Blood Pressure: 122 mmHg     Is BP treated: No     HDL Cholesterol: 41.6 mg/dL     Total Cholesterol: 142 mg/dL       Relevant Medications   atorvastatin  (LIPITOR) 20 MG tablet  metFORMIN  (GLUCOPHAGE ) 500 MG tablet   Onycholysis of toenail   Funginail x 6 months was beneficial but onycholysis to bilat great toenails persists.  Discussed possible etiologies including feet staying wet, onychomycosis.  Will refer to podiatry for further eval, consider culture prior to further oral antifungal treatment.       Relevant Orders   Ambulatory referral to Podiatry   Inattention   Stable period on Strattera  40mg  daily - continue.       Relevant Medications   atomoxetine  (STRATTERA ) 40 MG capsule   Type 2 diabetes mellitus with other specified complication (HCC)   Chronic, great control on current regimen - continue low dose metformin .       Relevant Medications   atorvastatin  (LIPITOR) 20 MG tablet   metFORMIN  (GLUCOPHAGE ) 500 MG tablet   Insomnia   Chronic, mixed initiation/maintenance insomnia, stable period on trazodone , melatonin, L-theanine PRN      Low serum vitamin B12   Continue b12 replacement MWF        Meds ordered this encounter  Medications   atomoxetine  (STRATTERA ) 40 MG capsule    Sig: Take 1 capsule (40 mg total) by mouth daily.    Dispense:  90 capsule    Refill:  3   atorvastatin  (LIPITOR) 20 MG tablet    Sig: Take 1 tablet (20 mg total) by mouth daily.    Dispense:  90 tablet    Refill:  3   metFORMIN  (GLUCOPHAGE ) 500 MG tablet    Sig: Take 1 tablet (500 mg total) by mouth daily with breakfast.    Dispense:  90 tablet    Refill:  3   traZODone  (DESYREL ) 50 MG tablet    Sig: Take 1 tablet (50 mg total) by mouth at bedtime as needed for sleep.    Dispense:  90 tablet    Refill:  1    Orders Placed This Encounter  Procedures   Ambulatory referral to Podiatry    Referral Priority:   Routine    Referral Type:   Consultation    Referral Reason:   Specialty Services Required    Requested Specialty:   Podiatry    Number of Visits Requested:   1    Patient Instructions  Continue current medicines  You are doing well today  Return as needed or in 1 year for next physical.   Follow up plan: Return in about 1 year (around 03/18/2025) for annual exam, prior fasting for blood work.  Anton Blas, MD    "

## 2025-03-16 ENCOUNTER — Other Ambulatory Visit

## 2025-03-23 ENCOUNTER — Encounter: Admitting: Family Medicine
